# Patient Record
Sex: Female | Born: 1960 | Race: Black or African American | Hispanic: No | Marital: Married | Smoking: Never smoker
Health system: Southern US, Community
[De-identification: ages and names within clinical notes are randomized; demographics above are authoritative.]

## PROBLEM LIST (undated history)

## (undated) DIAGNOSIS — E119 Type 2 diabetes mellitus without complications: Secondary | ICD-10-CM

## (undated) HISTORY — DX: Type 2 diabetes mellitus without complications: E11.9

## (undated) HISTORY — PX: KNEE SURGERY: SHX244

## (undated) HISTORY — PX: TUBAL LIGATION: SHX77

---

## 1998-08-25 ENCOUNTER — Other Ambulatory Visit: Admission: RE | Admit: 1998-08-25 | Discharge: 1998-08-25 | Payer: Self-pay | Admitting: Obstetrics and Gynecology

## 1998-09-20 ENCOUNTER — Ambulatory Visit (HOSPITAL_COMMUNITY): Admission: RE | Admit: 1998-09-20 | Discharge: 1998-09-20 | Payer: Self-pay | Admitting: Obstetrics and Gynecology

## 1999-10-24 ENCOUNTER — Other Ambulatory Visit: Admission: RE | Admit: 1999-10-24 | Discharge: 1999-10-24 | Payer: Self-pay | Admitting: Obstetrics and Gynecology

## 2000-10-30 ENCOUNTER — Other Ambulatory Visit: Admission: RE | Admit: 2000-10-30 | Discharge: 2000-10-30 | Payer: Self-pay | Admitting: Obstetrics and Gynecology

## 2000-11-29 ENCOUNTER — Ambulatory Visit (HOSPITAL_COMMUNITY): Admission: RE | Admit: 2000-11-29 | Discharge: 2000-11-29 | Payer: Self-pay

## 2003-02-13 ENCOUNTER — Emergency Department (HOSPITAL_COMMUNITY): Admission: EM | Admit: 2003-02-13 | Discharge: 2003-02-14 | Payer: Self-pay | Admitting: Emergency Medicine

## 2003-02-14 ENCOUNTER — Emergency Department (HOSPITAL_COMMUNITY): Admission: EM | Admit: 2003-02-14 | Discharge: 2003-02-14 | Payer: Self-pay | Admitting: Emergency Medicine

## 2003-02-14 ENCOUNTER — Encounter: Payer: Self-pay | Admitting: Emergency Medicine

## 2003-11-05 ENCOUNTER — Other Ambulatory Visit: Admission: RE | Admit: 2003-11-05 | Discharge: 2003-11-05 | Payer: Self-pay | Admitting: Obstetrics and Gynecology

## 2003-11-26 ENCOUNTER — Encounter (INDEPENDENT_AMBULATORY_CARE_PROVIDER_SITE_OTHER): Payer: Self-pay

## 2003-11-26 ENCOUNTER — Ambulatory Visit (HOSPITAL_COMMUNITY): Admission: RE | Admit: 2003-11-26 | Discharge: 2003-11-26 | Payer: Self-pay | Admitting: Obstetrics and Gynecology

## 2004-11-14 ENCOUNTER — Other Ambulatory Visit: Admission: RE | Admit: 2004-11-14 | Discharge: 2004-11-14 | Payer: Self-pay | Admitting: Obstetrics and Gynecology

## 2005-03-20 ENCOUNTER — Other Ambulatory Visit: Admission: RE | Admit: 2005-03-20 | Discharge: 2005-03-20 | Payer: Self-pay | Admitting: Obstetrics and Gynecology

## 2005-10-05 ENCOUNTER — Other Ambulatory Visit: Admission: RE | Admit: 2005-10-05 | Discharge: 2005-10-05 | Payer: Self-pay | Admitting: Obstetrics and Gynecology

## 2006-10-28 ENCOUNTER — Other Ambulatory Visit: Admission: RE | Admit: 2006-10-28 | Discharge: 2006-10-28 | Payer: Self-pay | Admitting: Obstetrics and Gynecology

## 2007-03-17 ENCOUNTER — Emergency Department (HOSPITAL_COMMUNITY): Admission: EM | Admit: 2007-03-17 | Discharge: 2007-03-17 | Payer: Self-pay | Admitting: Emergency Medicine

## 2007-09-05 ENCOUNTER — Encounter: Admission: RE | Admit: 2007-09-05 | Discharge: 2007-09-05 | Payer: Self-pay | Admitting: Internal Medicine

## 2007-10-01 ENCOUNTER — Encounter: Admission: RE | Admit: 2007-10-01 | Discharge: 2007-10-01 | Payer: Self-pay | Admitting: Internal Medicine

## 2007-11-12 ENCOUNTER — Other Ambulatory Visit: Admission: RE | Admit: 2007-11-12 | Discharge: 2007-11-12 | Payer: Self-pay | Admitting: Obstetrics and Gynecology

## 2007-11-20 ENCOUNTER — Encounter: Admission: RE | Admit: 2007-11-20 | Discharge: 2007-11-20 | Payer: Self-pay | Admitting: Obstetrics and Gynecology

## 2008-01-28 ENCOUNTER — Encounter (INDEPENDENT_AMBULATORY_CARE_PROVIDER_SITE_OTHER): Payer: Self-pay | Admitting: Diagnostic Radiology

## 2008-01-28 ENCOUNTER — Other Ambulatory Visit: Admission: RE | Admit: 2008-01-28 | Discharge: 2008-01-28 | Payer: Self-pay | Admitting: Diagnostic Radiology

## 2008-01-28 ENCOUNTER — Encounter: Admission: RE | Admit: 2008-01-28 | Discharge: 2008-01-28 | Payer: Self-pay | Admitting: Internal Medicine
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
## 2008-11-25 ENCOUNTER — Encounter: Admission: RE | Admit: 2008-11-25 | Discharge: 2008-11-25 | Payer: Self-pay | Admitting: Obstetrics and Gynecology

## 2010-10-10 ENCOUNTER — Encounter: Admission: RE | Admit: 2010-10-10 | Discharge: 2010-10-10 | Payer: Self-pay | Admitting: Obstetrics

## 2010-11-29 ENCOUNTER — Encounter
Admission: RE | Admit: 2010-11-29 | Discharge: 2010-11-29 | Payer: Self-pay | Source: Home / Self Care | Attending: Internal Medicine | Admitting: Internal Medicine

## 2011-04-21 NOTE — H&P (Signed)
NAME:  Allison Lopez, Allison Lopez                        ACCOUNT NO.:  [id number]   MEDICAL RECORD NO.:  [id number]                   PATIENT TYPE:  AMB   LOCATION:  [facility]                                  FACILITY:  [facility]   PHYSICIAN:  James A. Ashley Royalty, M.D.             DATE OF BIRTH:  August 25, 1961   DATE OF ADMISSION:  11/25/2003  DATE OF DISCHARGE:                                HISTORY & PHYSICAL   HISTORY OF PRESENT ILLNESS:  This is a 49 year old nulligravida who  presented November 05, 2003 for annual examination. At the time of that  encounter, she complained of intramenstrual bleeding for 2 to 3 months. She  has a known fibroid uterus. She returned November 17, 2003 for  sonohysterogram. The sonohysterogram several echogenic foci within the  endometrial cavity. They were seen both on the anterior and the posterior  surfaces of the cavity. The patient presents for diagnostic/operative  hysteroscopy, dilatation and curettage.   MEDICATIONS:  None.   PAST MEDICAL HISTORY:  Negative.   PAST SURGICAL HISTORY:  Hysteroscopy, laser of the cervix, and knee surgery.   ALLERGIES:  None.   FAMILY HISTORY:  Positive for hypertension and diabetes mellitus and breast  cancer.   SOCIAL HISTORY:  The patient denies the use of tobacco or significant  alcohol.   REVIEW OF SYSTEMS:  Noncontributory.   PHYSICAL EXAMINATION:  GENERAL:  A well developed, well nourished, pleasant  black female in no acute distress.  VITAL SIGNS:  Stable.  SKIN:  Warm and dry without lesions.  LYMPH:  There is no supraclavicular, cervical, or inguinal adenopathy.  HEENT:  Normocephalic.  NECK:  Supple without thyromegaly.  CHEST:  Lungs are clear.  CARDIAC:  Regular rate and rhythm. Without murmur, rub, or gallop.  BREAST:  Examination deferred.  ABDOMEN:  Soft, nontender without masses or organomegaly. Bowel sounds are  active.  MUSCULOSKELETAL:  Examination reveals full range of motion  without edema,  cyanosis or  CVA tenderness.  PELVIC:  Examination (November 05, 2003) external genitalia within normal  limits. Vagina and cervix were without gross lesions. Bimanual examination  reveals the uterus to be 9 x 5 x 4 cm with several small fibroids. There are  no adnexal masses.   IMPRESSION:  1. Fibroid uterus.  2. Abnormal uterine bleeding with several echogenic foci on sonohysterogram,     consistent with endometrial polyps versus submucosal fibroids.   PLAN:  1. Diagnostic/operative hysteroscopy.  2. Dilatation and curettage.  3. Risks, benefits, and the alternatives were discussed with the patient.     She states that she understands and accepts. Questions were invited and     answered.  James A. Ashley Royalty, M.D.    JAM/MEDQ  D:  11/25/2003  T:  11/25/2003  Job:  [id number]

## 2011-04-21 NOTE — Op Note (Signed)
NAME:  Allison Lopez, Allison Lopez                        ACCOUNT NO.:  [id number]   MEDICAL RECORD NO.:  [id number]                   PATIENT TYPE:  AMB   LOCATION:  [facility]                                  FACILITY:  [facility]   PHYSICIAN:  James A. Ashley Royalty, M.D.             DATE OF BIRTH:  [birth year]   DATE OF PROCEDURE:  11/26/2003  DATE OF DISCHARGE:                                 OPERATIVE REPORT   PREOPERATIVE DIAGNOSES:  1. Fibroid uterus.  2. Abnormal uterine bleeding, possibly secondary to intrauterine polyp or     fibroid.   POSTOPERATIVE DIAGNOSES:  1. Fibroid uterus.  2. Abnormal uterine bleeding, possibly secondary to intrauterine polyp or     fibroid.  3. Pathology pending.  4. No obvious intrauterine polyp or fibroid.   PROCEDURES:  1. Diagnostic hysteroscopy.  2. Dilatation and curettage.   SURGEON:  Rudy Jew. Ashley Royalty, M.D.   ANESTHESIA:  General.   ESTIMATED BLOOD LOSS:  Less than 25 mL.   COMPLICATIONS:  None.   PACKS AND DRAINS:  None.   DESCRIPTION OF PROCEDURE:  The patient was taken to the operating room and  placed in the dorsal supine position, where after adequate general  anesthesia was administered she was placed in the lithotomy position and  prepped and draped in the usual manner for vaginal surgery.  A posterior  weighted retractor was placed per vagina.  The anterior lip of the cervix  was grasped with a single-tooth tenaculum.  The uterus was gently sounded to  8 cm and noted to be slightly anteverted.  The cervix was serially dilated  to a size 29 [place] using [employer] dilators.  The resectoscope was placed into  the uterine cavity using sorbitol as a distention medium.  The uterine  cavity was thoroughly inspected.  The endometrium was noted to be quite  fluffy.  Initially the endometrium was obscuring the visualization of the  tubal ostia; however, it was gently scraped off with the resectoscopic wire  without using any electricity so that visualization  of the ostia was  excellent.  Appropriate photos were obtained.  There was no evidence of any  intrauterine polyp or fibroid.   Attention was then turned to the uterine curettage.  A medium-sized curette  was placed in the uterine cavity.  Curettage was conducted.  First equal-  quadrant technique was used.  Then a therapeutic technique was used.  Curettings were submitted to pathology for histologic studies.  At this  point the formal procedure was ended.  The  tenaculum was removed and the small bleeder on the right aspect of the  tenaculum site was easily controlled with a 3-0 Vicryl interrupted suture.  Hemostasis was noted and the procedure terminated.   The patient was taken to the recovery room in excellent condition.  James A. Ashley Royalty, M.D.    JAM/MEDQ  D:  11/26/2003  T:  11/26/2003  Job:  [id number]

## 2011-04-21 NOTE — H&P (Signed)
[facility]  Patient:    Allison Lopez, Allison Lopez                       MRN: [id number] Adm. Date:  11/29/00 Attending:  Fayrene Fearing A. Ashley Royalty, M.D.                         History and Physical  HISTORY OF PRESENT ILLNESS:   This is a 50 year old nulligravida who presents herself for attempt at permanent surgical sterilization.  She was seen in the office on November 22, 2000, for ultrasound which revealed several intramural fibroids and a 1.7 cm complex cystic follicle of the left adnexa.  MEDICATIONS:                  Ortho-Novum 777.  PAST MEDICAL HISTORY:         Medical:  Negative.  Surgical:  Diagnostic hysteroscopy and D&C, laser of the cervix, knee surgery.  ALLERGIES:                    None.  FAMILY HISTORY:               Positive for hypertension, diabetes, and breast cancer.  SOCIAL HISTORY:               Patient denies the use of tobacco or significant alcohol.  REVIEW OF SYSTEMS:            Noncontributory.  PHYSICAL EXAMINATION:  GENERAL:                      A well-developed, well-nourished, pleasant black female in no acute distress, and afebrile.  VITAL SIGNS:                  Stable.  SKIN:                         Warm and dry without lesions.  LYMPH:                        There is no supraclavicular, cervical, or                               inguinal adenopathy.  HEENT:                        Normocephalic.  NECK:                         Supple without thyromegaly.  CHEST:                        Lungs are clear.  CARDIAC:                      Regular rate and rhythm without murmurs, gallops, or rubs.  BREASTS:                      Exam deferred.  ABDOMEN:                      Soft and nontender without masses or organomegaly.  Bowel sounds are active.  MUSCULOSKELETAL:  Examination reveals full range of motion without edema, cyanosis, or CVA tenderness.  PELVIC:                       Examination deferred  until examination under anesthesia.  IMPRESSION:                   1. Cystic follicle of the left adnexa.                               2. Fibroid uterus.                               3. Desire for attempt at permanent surgical                                  sterilization.  PLAN:                         Laparoscopic bilateral tubal sterilization procedure.  Risks, benefits, complications, and alternatives were fully discussed with the patient.  Permanency and failure rates of various techniques including but not limited to Falope-ring, bipolar cautery, mini laparotomy, and partial salpingectomy were discussed and accepted.  Questions were invited and answered. DD:  11/28/00 TD:  11/28/00 Job: [id number]

## 2011-04-21 NOTE — Op Note (Signed)
[facility]  Patient:    Allison Lopez, Allison Lopez                     MRN: [id number] Proc. Date: 11/29/00 Adm. Date:  [id number] Attending:  Wandalee Ferdinand                           Operative Report  PREOPERATIVE DIAGNOSIS:       Desires permanent surgical sterilization.  POSTOPERATIVE DIAGNOSIS:      Desires permanent surgical sterilization.  PROCEDURE:                    Laparoscopic bilateral tubal sterilization.                               (Falopes ring).  SURGEON:                      Rudy Jew. Ashley Royalty, M.D.  ANESTHESIA:                   General.  ESTIMATED BLOOD LOSS:         Less than 25 cc.  COMPLICATIONS:                None.  PACKS AND DRAINS:             None.  DESCRIPTION OF PROCEDURE:     The patient was sent to the operating room and placed in the dorsosupine position.  After adequate general anesthesia was administered, she was placed in the lithotomy position and prepped and draped in the usual manner for abdominal and vaginal surgery.  Posterior weighted retractor was placed into the vagina and the anterior lip of the cervix was grasped with a single-tooth tenaculum.  Jarcho uterine manipulator was placed per cervix and held in place with the tenaculum.  The bladder was drained with a red rubber catheter.  A 1.5 cm infraumbilical incision was made in the longitudinal plane.  A size 10-11 disposable laparoscopic trocar was placed in the abdominal cavity.  Its location was verified by placement of the laparoscope.  There was no evidence of any trauma.  Pneumoperitoneum was created with CO2 and maintained throughout the procedure.  Next, a midline suprapubic 7 mm trocar was placed into the abdominal cavity under direct visualization, using transillumination technique.  Through the Nissen and prior to these trocar placements, approximately 8 cc total of 0.25% Marcaine was placed into the skin to aid in analgesia.  The  pelvis was thoroughly surveyed.  The uterus was of normal size, shape and contour; without evidence of any polypoids or endometriosis.  The tubes were of normal size, shape, contour and length bilaterally, with luxuriant fimbria. The ovaries appeared to be approximately 3 x 3 x 2 cm bilaterally, without evidence of any endometriosis or abnormal cysts.  There appeared to be a stigma on the left ovary.  Appropriate photographs were obtained.  The remainder of the peritoneal surfaces were smooth and glistening, without evidence of any endometriosis or adhesions.  The right fallopian tube was grasped in the distal isthmus at proximal ______ ampullary portion and a Falopes ring applied.  An excellent knuckle of tube was noted to be contained within the ring.  Excellent lengths of tissue was noted.  The left fallopian tube was grasped at the distal isthmus to proximal  ampullary portion.  A Falopes ring was applied without difficulty.  An excellent knuckle of tube was noted to be contained within the ring. Excellent lengths of tissue was noted.  At this point the patient was felt to have benefited maximally from the procedure.  The abdominal instruments were removed.  The pneumoperitoneum evacuated.  Fascial defects were closed with 0 Vicryl in interrupted fashion. The skin was closed with Dermabond.  Vaginal instruments were removed. Hemostasis was noted.  The procedure was terminated.  The patient tolerated the procedure extremely well; returned to the recovery room in good condition. DD:  11/29/00 TD:  11/29/00 Job: [id number]

## 2011-06-15 ENCOUNTER — Other Ambulatory Visit: Payer: Self-pay | Admitting: Internal Medicine

## 2011-06-15 DIAGNOSIS — E042 Nontoxic multinodular goiter: Secondary | ICD-10-CM

## 2011-06-28 ENCOUNTER — Other Ambulatory Visit: Payer: Self-pay

## 2011-07-05 ENCOUNTER — Ambulatory Visit
Admission: RE | Admit: 2011-07-05 | Discharge: 2011-07-05 | Disposition: A | Discharge status: Home / Self Care | Payer: 59 | Source: Ambulatory Visit | Attending: Internal Medicine | Admitting: Internal Medicine

## 2011-07-05 ENCOUNTER — Other Ambulatory Visit (HOSPITAL_COMMUNITY)
Admission: RE | Admit: 2011-07-05 | Discharge: 2011-07-05 | Disposition: A | Discharge status: Home / Self Care | Payer: 59 | Source: Ambulatory Visit | Attending: Interventional Radiology | Admitting: Interventional Radiology

## 2011-07-05 DIAGNOSIS — E042 Nontoxic multinodular goiter: Secondary | ICD-10-CM

## 2011-07-05 DIAGNOSIS — E049 Nontoxic goiter, unspecified: Secondary | ICD-10-CM | POA: Insufficient documentation

## 2011-09-26 ENCOUNTER — Other Ambulatory Visit: Payer: Self-pay | Admitting: Obstetrics

## 2011-09-26 DIAGNOSIS — Z1231 Encounter for screening mammogram for malignant neoplasm of breast: Secondary | ICD-10-CM

## 2011-10-12 ENCOUNTER — Ambulatory Visit: Payer: 59

## 2011-11-09 ENCOUNTER — Ambulatory Visit: Payer: 59

## 2012-01-18 ENCOUNTER — Ambulatory Visit: Payer: 59

## 2012-01-19 ENCOUNTER — Ambulatory Visit: Payer: 59

## 2012-01-22 ENCOUNTER — Ambulatory Visit
Admission: RE | Admit: 2012-01-22 | Discharge: 2012-01-22 | Disposition: A | Discharge status: Home / Self Care | Payer: 59 | Source: Ambulatory Visit | Attending: Obstetrics | Admitting: Obstetrics

## 2012-01-22 DIAGNOSIS — Z1231 Encounter for screening mammogram for malignant neoplasm of breast: Secondary | ICD-10-CM

## 2012-08-06 ENCOUNTER — Other Ambulatory Visit: Payer: Self-pay | Admitting: Internal Medicine

## 2012-08-06 DIAGNOSIS — E063 Autoimmune thyroiditis: Secondary | ICD-10-CM

## 2012-08-06 DIAGNOSIS — E042 Nontoxic multinodular goiter: Secondary | ICD-10-CM

## 2012-08-14 ENCOUNTER — Ambulatory Visit
Admission: RE | Admit: 2012-08-14 | Discharge: 2012-08-14 | Disposition: A | Discharge status: Home / Self Care | Payer: 59 | Source: Ambulatory Visit | Attending: Internal Medicine | Admitting: Internal Medicine

## 2012-08-14 DIAGNOSIS — E042 Nontoxic multinodular goiter: Secondary | ICD-10-CM

## 2012-08-14 DIAGNOSIS — E063 Autoimmune thyroiditis: Secondary | ICD-10-CM

## 2012-12-06 ENCOUNTER — Encounter (INDEPENDENT_AMBULATORY_CARE_PROVIDER_SITE_OTHER): Payer: Self-pay

## 2012-12-09 ENCOUNTER — Encounter (INDEPENDENT_AMBULATORY_CARE_PROVIDER_SITE_OTHER): Payer: Self-pay | Admitting: Surgery

## 2012-12-09 ENCOUNTER — Ambulatory Visit (INDEPENDENT_AMBULATORY_CARE_PROVIDER_SITE_OTHER): Payer: 59 | Admitting: Surgery

## 2012-12-09 VITALS — BP 122/86 | HR 90 | Temp 97.8°F | Ht 66.0 in | Wt 210.2 lb

## 2012-12-09 DIAGNOSIS — E042 Nontoxic multinodular goiter: Secondary | ICD-10-CM | POA: Insufficient documentation

## 2012-12-09 NOTE — Patient Instructions (Signed)

## 2012-12-09 NOTE — Progress Notes (Signed)
General Surgery [facility]  Chief Complaint  Patient presents with  . New Evaluation    eval multinodular goiter - referral from Dr. Debara Pickett    HISTORY: Patient is a 52 year old black female referred by her endocrinologist for evaluation for total thyroidectomy. Patient was diagnosed by her primary care physician 3-4 years ago with thyroid goiter. She was followed up with clinical examination and ultrasound. She underwent percutaneous needle biopsy with benign cytopathology. Recent followup ultrasound examination demonstrated interval enlargement of the thyroid gland with the right lobe measuring 6.4 cm and the left lobe measuring 4.8 cm in maximum dimension. There were multiple dominant nodules in the right lobe measuring 3.9 cm, 3.2 cm, 1.9 cm, and 1.2 cm. Nodules in the left lobe were small with no nodule measuring more than 5 mm in diameter.  Patient has never been on thyroid medication. She has had no prior head or neck surgery. There is a family history of multinodular thyroid in the patient's mother. There is no family history of thyroid cancer. There is no family history of other endocrinopathy.  History reviewed. No pertinent past medical history.   No current outpatient prescriptions on file.     No Known Allergies   Family History  Problem Relation Age of Onset  . Cancer Maternal Aunt     breast  . Cancer Paternal Uncle     throat     History   Social History  . Marital Status: Married    Spouse Name: N/A    Number of Children: N/A  . Years of Education: N/A   Social History Main Topics  . Smoking status: Never Smoker   . Smokeless tobacco: None  . Alcohol Use: Yes     Comment: 1xweek  . Drug Use: No  . Sexually Active:    Other Topics Concern  . None   Social History Narrative  . None     REVIEW OF SYSTEMS - PERTINENT POSITIVES ONLY: Denies tremor. Denies palpitations. Denies compressive symptoms. Occasionally notes  "soreness".  EXAM: Filed Vitals:   12/09/12 1330  BP: 122/86  Pulse: 90  Temp: 97.8 F (36.6 C)    HEENT: normocephalic; pupils equal and reactive; sclerae clear; dentition good; mucous membranes moist NECK:  Enlarged multinodular right thyroid lobe measuring approximately 7 cm in greatest dimension; nontender; left lobe slightly firm without dominant mass; asymmetric on extension; no palpable anterior or posterior cervical lymphadenopathy; no supraclavicular masses; no tenderness CHEST: clear to auscultation bilaterally without rales, rhonchi, or wheezes CARDIAC: regular rate and rhythm without significant murmur; peripheral pulses are full EXT:  non-tender without edema; no deformity NEURO: no gross focal deficits; no sign of tremor   LABORATORY RESULTS: See [facility] ([facility]) for most recent results   RADIOLOGY RESULTS: See [facility] ([facility]) for most recent results   IMPRESSION: Multinodular thyroid goiter with interval enlargement of dominant right thyroid nodules  PLAN: I had a lengthy discussion with the patient and her mother. I provided them with written literature to review. We discussed the indications for thyroidectomy. The patient does not have an absolute indication for thyroidectomy and could be safely followed with sequential ultrasound examination, physical examination, and repeat percutaneous biopsy. However, after careful consideration, the patient and her mother would like to proceed with total thyroidectomy.  We reviewed the procedure of total thyroidectomy. We discussed risk and benefits of the procedure including the potential for recurrent laryngeal nerve injury and injury to parathyroid glands. We discussed  the procedure, the hospital course to be anticipated, and the postoperative recovery and returned activity. We discussed the need for lifelong thyroid hormone replacement therapy. They understand and wish to proceed.  The risks and  benefits of the procedure have been discussed at length with the patient.  The patient understands the proposed procedure, potential alternative treatments, and the course of recovery to be expected.  All of the patient's questions have been answered at this time.  The patient wishes to proceed with surgery.  Velora Heckler, MD, FACS General & Endocrine Surgery [facility]   Visit Diagnoses: 1. Multinodular goiter (nontoxic)     Primary Care Physician: Katy Apo, MD

## 2013-02-19 IMAGING — US US THYROID BIOPSY
1 series · 13 of 13 positions shown · non-contrast
Comparison: none

CLINICAL DATA: Interval increase in size of the right thyroid
nodules.

ULTRASOUND-GUIDED THYROID ASPIRATION BIOPSY, MID-RIGHT
TECHNIQUE: Survey ultrasound was performed and the dominant lesion
in the mid right lobe was localized.  An appropriate skin entry
site was determined.  Skin was marked, then prepped with Betadine,
draped in usual sterile fashion, and infiltrated locally with 1%
lidocaine.  Under real-time ultrasound guidance, 3  passes were
made into the lesion with 25 gauge needles.  The patient tolerated
procedure well, with no immediate complications.
IMPRESSION
1.  Technically successful ultrasound-guided thyroid aspiration
biopsy
.
ULTRASOUND-GUIDED THYROID ASPIRATION BIOPSY, INFERIOR RIGHT
in the inferior pole right lobe was localized.  An appropriate skin
entry site was determined.  Skin was marked, then prepped with
Betadine, draped in usual sterile fashion, and infiltrated locally
with 1% lidocaine.  Under real-time ultrasound guidance, 3  passes
were made into the lesion with 25 gauge needles.  The patient
tolerated procedure well, with no immediate complications.

[Series 1: us thyroid biopsy · 0.08mm/px · 13 acquisitions, 13 frames shown]
[im 1/13]
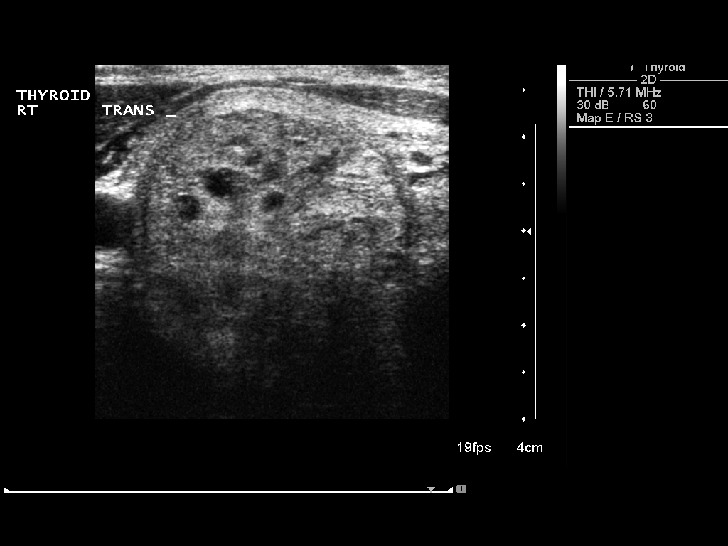
[im 2/13]
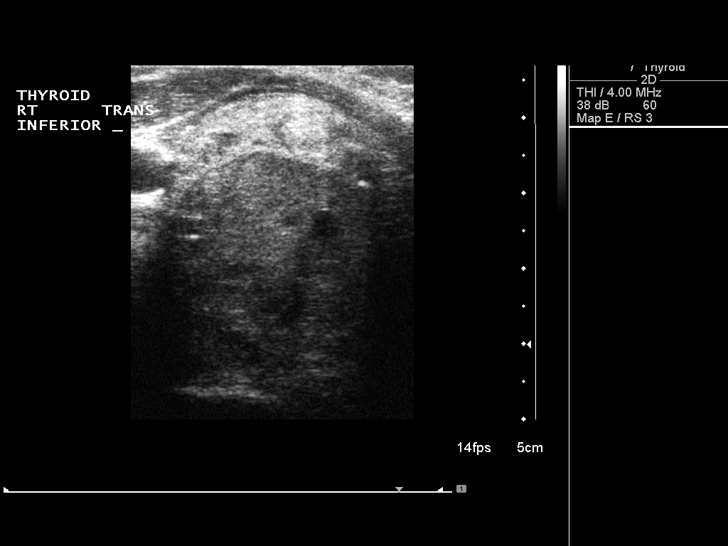
[im 3/13]
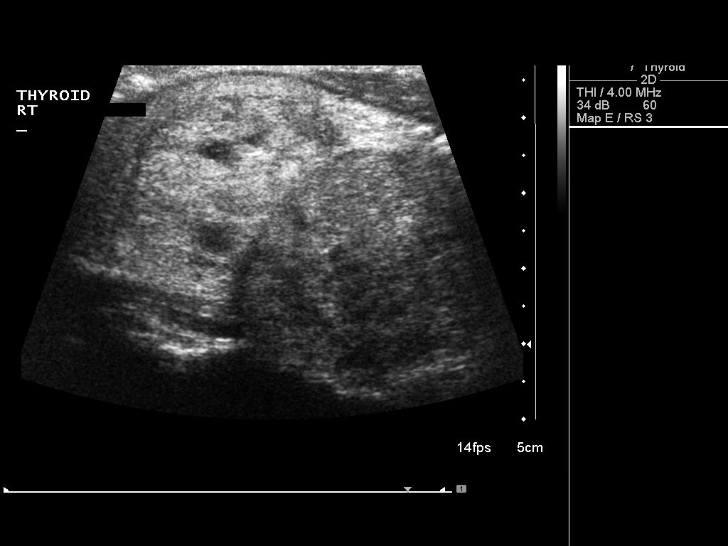
[im 4/13]
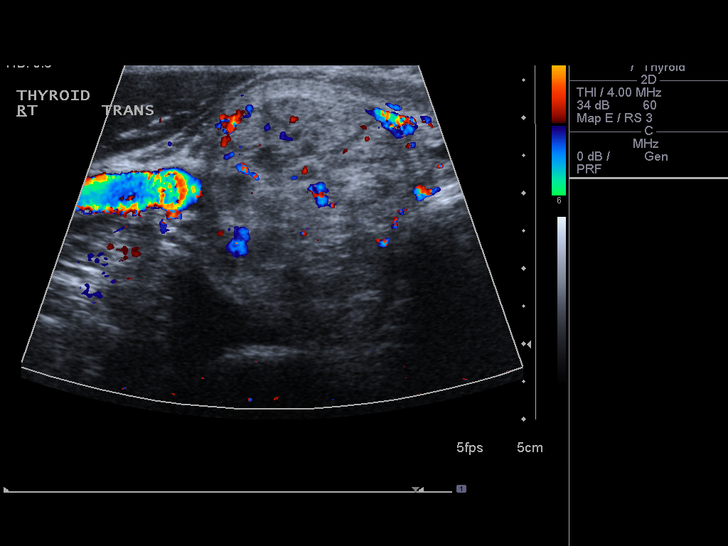
[im 5/13]
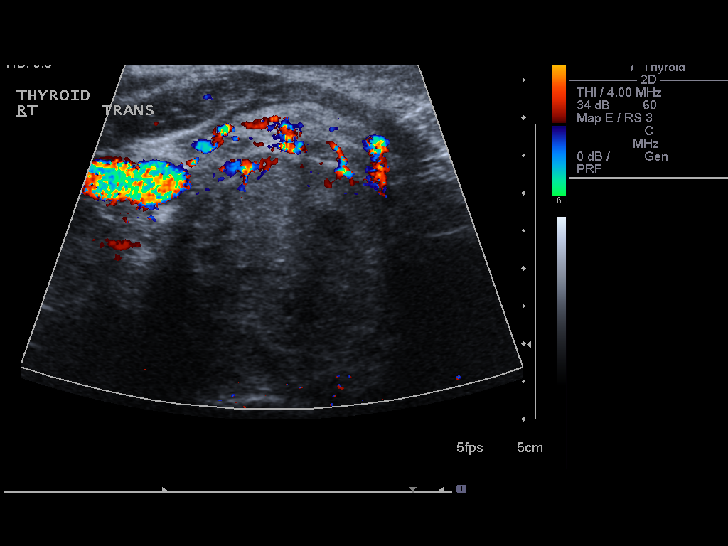
[im 6/13]
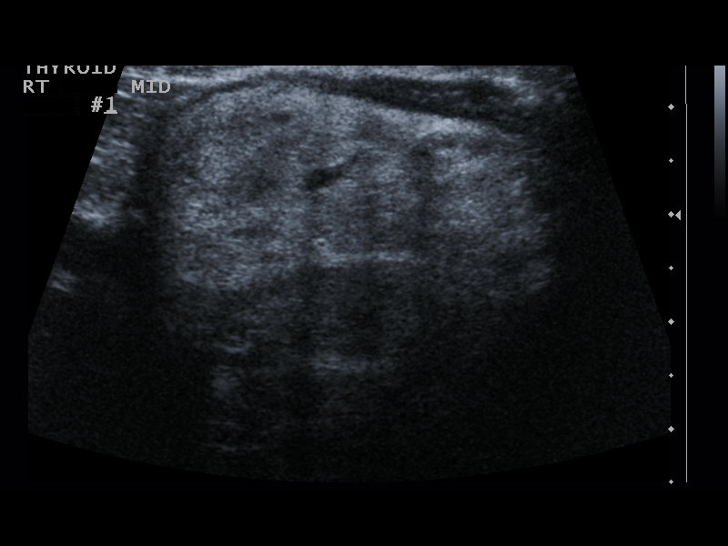
[im 7/13]
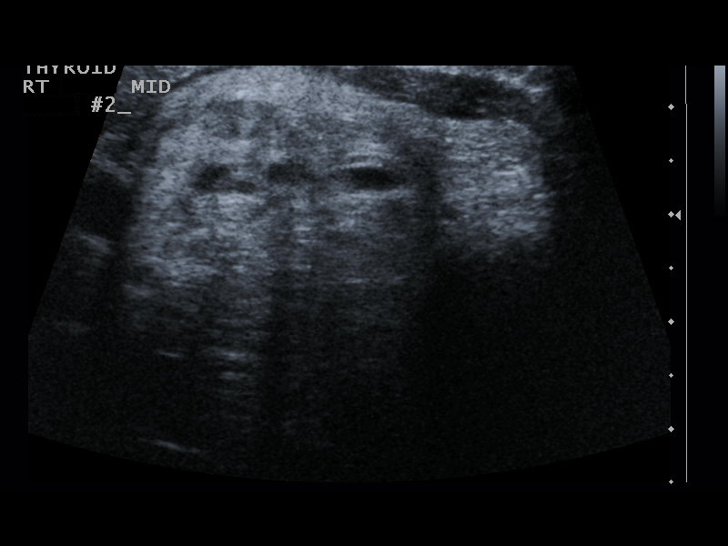
[im 8/13]
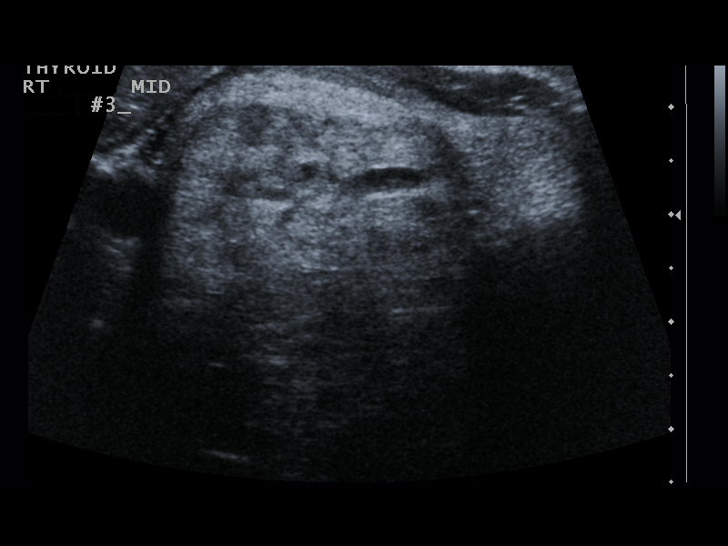
[im 9/13]
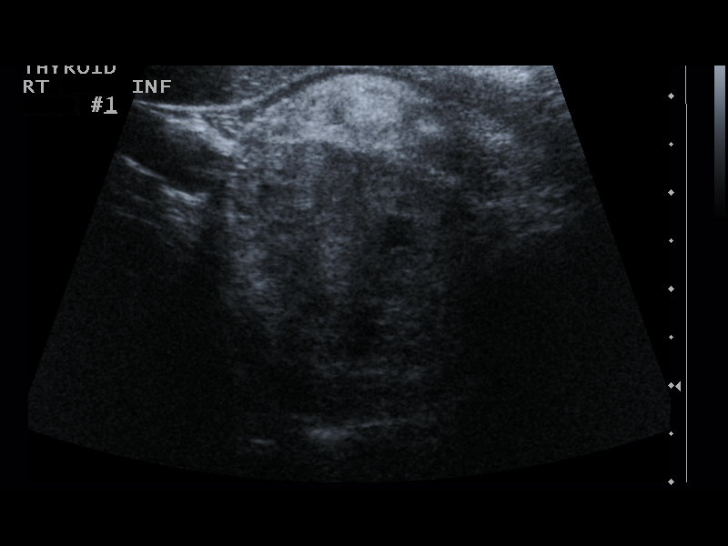
[im 10/13]
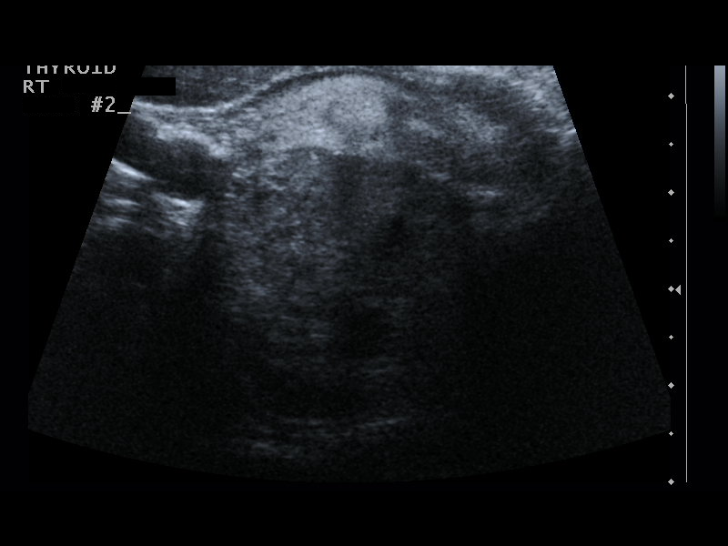
[im 11/13]
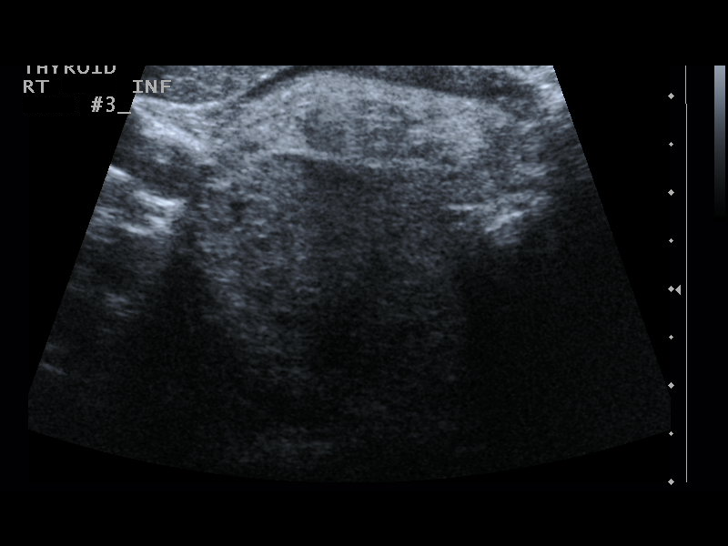
[im 12/13]
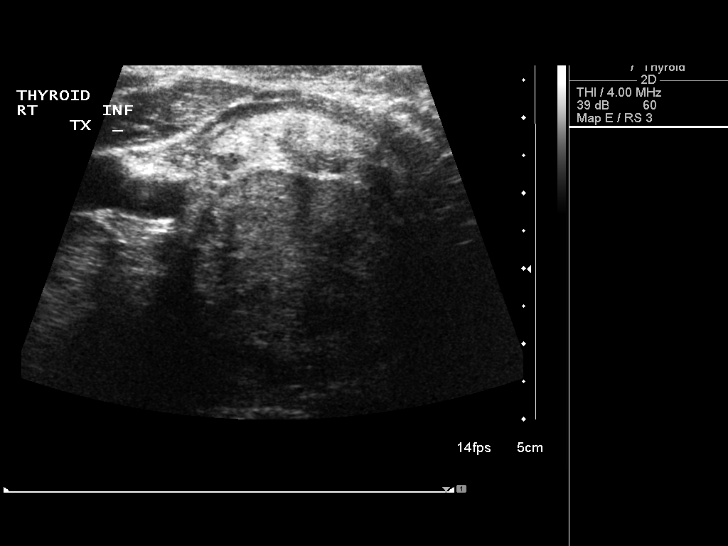
[im 13/13]
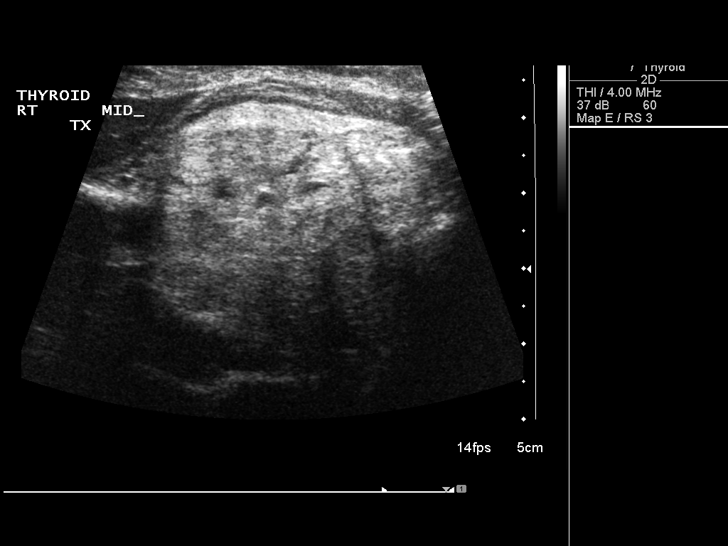

[13 of 13 positions shown; findings below may reference images not displayed]

## 2014-05-25 ENCOUNTER — Other Ambulatory Visit: Payer: Self-pay

## 2014-05-25 DIAGNOSIS — Z1231 Encounter for screening mammogram for malignant neoplasm of breast: Secondary | ICD-10-CM

## 2014-05-29 ENCOUNTER — Ambulatory Visit
Admission: RE | Admit: 2014-05-29 | Discharge: 2014-05-29 | Disposition: A | Discharge status: Home / Self Care | Payer: 59 | Source: Ambulatory Visit

## 2014-05-29 DIAGNOSIS — Z1231 Encounter for screening mammogram for malignant neoplasm of breast: Secondary | ICD-10-CM

## 2014-06-01 ENCOUNTER — Other Ambulatory Visit: Payer: Self-pay | Admitting: Obstetrics

## 2014-06-01 DIAGNOSIS — R928 Other abnormal and inconclusive findings on diagnostic imaging of breast: Secondary | ICD-10-CM

## 2014-06-10 ENCOUNTER — Other Ambulatory Visit: Payer: Self-pay | Admitting: Obstetrics

## 2014-06-10 ENCOUNTER — Ambulatory Visit
Admission: RE | Admit: 2014-06-10 | Discharge: 2014-06-10 | Disposition: A | Discharge status: Home / Self Care | Payer: 59 | Source: Ambulatory Visit | Attending: Obstetrics | Admitting: Obstetrics

## 2014-06-10 DIAGNOSIS — R928 Other abnormal and inconclusive findings on diagnostic imaging of breast: Secondary | ICD-10-CM

## 2014-06-10 DIAGNOSIS — N6310 Unspecified lump in the right breast, unspecified quadrant: Secondary | ICD-10-CM

## 2014-06-17 ENCOUNTER — Ambulatory Visit
Admission: RE | Admit: 2014-06-17 | Discharge: 2014-06-17 | Disposition: A | Discharge status: Home / Self Care | Payer: 59 | Source: Ambulatory Visit | Attending: Obstetrics | Admitting: Obstetrics

## 2014-06-17 DIAGNOSIS — N6310 Unspecified lump in the right breast, unspecified quadrant: Secondary | ICD-10-CM

## 2014-06-18 ENCOUNTER — Other Ambulatory Visit: Payer: Self-pay | Admitting: Obstetrics

## 2014-06-18 DIAGNOSIS — R921 Mammographic calcification found on diagnostic imaging of breast: Secondary | ICD-10-CM

## 2014-06-22 ENCOUNTER — Ambulatory Visit
Admission: RE | Admit: 2014-06-22 | Discharge: 2014-06-22 | Disposition: A | Discharge status: Home / Self Care | Payer: 59 | Source: Ambulatory Visit | Attending: Obstetrics | Admitting: Obstetrics

## 2014-06-22 ENCOUNTER — Other Ambulatory Visit: Payer: Self-pay | Admitting: Obstetrics

## 2014-06-22 DIAGNOSIS — R921 Mammographic calcification found on diagnostic imaging of breast: Secondary | ICD-10-CM

## 2014-06-22 DIAGNOSIS — N6310 Unspecified lump in the right breast, unspecified quadrant: Secondary | ICD-10-CM

## 2014-06-23 ENCOUNTER — Other Ambulatory Visit: Payer: Self-pay | Admitting: Internal Medicine

## 2014-06-23 DIAGNOSIS — E049 Nontoxic goiter, unspecified: Secondary | ICD-10-CM

## 2014-06-29 ENCOUNTER — Ambulatory Visit
Admission: RE | Admit: 2014-06-29 | Discharge: 2014-06-29 | Disposition: A | Discharge status: Home / Self Care | Payer: 59 | Source: Ambulatory Visit | Attending: Internal Medicine | Admitting: Internal Medicine

## 2014-06-29 DIAGNOSIS — E049 Nontoxic goiter, unspecified: Secondary | ICD-10-CM

## 2015-01-21 ENCOUNTER — Inpatient Hospital Stay: Admission: RE | Admit: 2015-01-21 | Payer: Self-pay | Source: Ambulatory Visit

## 2015-01-21 ENCOUNTER — Ambulatory Visit
Admission: RE | Admit: 2015-01-21 | Discharge: 2015-01-21 | Disposition: A | Discharge status: Home / Self Care | Payer: 59 | Source: Ambulatory Visit | Attending: Obstetrics | Admitting: Obstetrics

## 2015-01-21 DIAGNOSIS — N6310 Unspecified lump in the right breast, unspecified quadrant: Secondary | ICD-10-CM

## 2015-08-04 ENCOUNTER — Ambulatory Visit: Payer: 59 | Attending: Physician Assistant

## 2015-08-04 DIAGNOSIS — R609 Edema, unspecified: Secondary | ICD-10-CM | POA: Diagnosis present

## 2015-08-04 DIAGNOSIS — R29898 Other symptoms and signs involving the musculoskeletal system: Secondary | ICD-10-CM | POA: Diagnosis present

## 2015-08-04 DIAGNOSIS — M25569 Pain in unspecified knee: Secondary | ICD-10-CM | POA: Diagnosis present

## 2015-08-04 DIAGNOSIS — R600 Localized edema: Secondary | ICD-10-CM

## 2015-08-04 DIAGNOSIS — M228X9 Other disorders of patella, unspecified knee: Secondary | ICD-10-CM

## 2015-08-04 NOTE — Therapy (Addendum)
[place], [place], [address] Phone: [phone]   Fax:  [phone]  Physical Therapy Evaluation  Patient Details  Name: Allison Lopez MRN: [id number] Date of Birth: [birth year] Referring Provider:  Gerrit Halls, PA-C  Encounter Date: 08/04/2015      PT End of Session - 08/04/15 0748    Visit Number 1   Number of Visits 6   Date for PT Re-Evaluation 09/15/15   PT Start Time 0711   PT Stop Time 0750   PT Time Calculation (min) 39 min   Activity Tolerance Patient tolerated treatment well      No past medical history on file.  Past Surgical History  Procedure Laterality Date  . Tubal ligation    . Knee surgery      There were no vitals filed for this visit.  Visit Diagnosis:  Edema - Plan: PT plan of care cert/re-cert  Patellar tracking disorder, unspecified laterality - Plan: PT plan of care cert/re-cert  Weakness of hip, unspecified laterality - Plan: PT plan of care cert/re-cert      Subjective Assessment - 08/04/15 0713    Subjective My knee caps are tilted out and need exercise to put them in middle.  I have had knee surgery RT knee for displaced patella. She reports no functional problems. She has some swelling daily on RT knee. . I have a brace   How long can you sit comfortably? As needed   How long can you stand comfortably? as needed   How long can you walk comfortably? As needed   Diagnostic tests xray :OA   Patient Stated Goals For improved strength to align patella   Currently in Pain? No/denies            [facility] PT Assessment - 08/04/15 0718    Assessment   Medical Diagnosis OA both knees   Onset Date/Surgical Date --  10 years ago   Next MD Visit PRN   Prior Therapy None   Precautions   Precautions None   Restrictions   Weight Bearing Restrictions No   Balance Screen   Has the patient fallen in the past 6 months No   Prior Function   Level of Independence  Independent   Cognition   Overall Cognitive Status Within Functional Limits for tasks assessed   ROM / Strength   AROM / PROM / Strength AROM;Strength   AROM   AROM Assessment Site Knee;Hip   Right/Left Knee Right;Left   Right Knee Extension 0   Right Knee Flexion 128   Left Knee Extension 0   Left Knee Flexion 128   Strength   Strength Assessment Site Hip;Knee   Right/Left Hip Right;Left   Right Hip Flexion 5/5   Right Hip Extension 4/5   Right Hip External Rotation  --  5/5   Right Hip Internal Rotation  --  5/5   Right Hip ABduction 4+/5   Right Hip ADduction 5/5   Left Hip Flexion 5/5   Left Hip Extension 4/5   Left Hip External Rotation  --  5/5   Left Hip Internal Rotation  --  5/5   Left Hip ABduction 4+/5   Left Hip ADduction 5/5   Right/Left Knee Right;Left   Right Knee Flexion 4+/5   Right Knee Extension 5/5   Left Knee Flexion 4+/5   Left Knee Extension 5/5   Flexibility   Soft Tissue Assessment /Muscle Length yes   Hamstrings 85  degrees bilaterally   Palpation   Patella mobility WNL    Palpation comment PAtella tiled and lateral bilaterally but RT patella drifts significantly laterally with quad contraction with crepitus noted. LT no crepitus   Ambulation/Gait   Gait Comments WNL                           PT Education - 08/04/15 0740    Education provided Yes   Education Details POC Quads exercise   Person(s) Educated Patient   Methods Explanation;Demonstration;Tactile cues;Verbal cues;Handout   Comprehension Returned demonstration;Verbalized understanding          PT Short Term Goals - 08/04/15 0753    PT SHORT TERM GOAL #1   Title She will be independent with inital HEP   Time 3   Period Weeks   Status New           PT Long Term Goals - 08/04/15 0753    PT LONG TERM GOAL #1   Title She will be independent with all HEP issued as of last visit   Time 6   Period Weeks   Status New               Plan -  08/04/15 0749    Clinical Impression Statement Ms Kiesel demo some mild weakness in both hip extension and abduciton but has notrmal strength in LE. Range is WNL.  She has somealignment issues with patella and significant lateral tracking of RT patella with terminal exension. Will keep exercies less than full range initially. No pain. mild edema RT knee.   She should do well woithj an exercise program though it is not likely the RT patella tracking will improve si=gnificantly due to previous surgery and distance it moves laterally   Pt will benefit from skilled therapeutic intervention in order to improve on the following deficits Decreased strength  patella tracking laterally   Rehab Potential Good   PT Frequency 1x / week   PT Duration 6 weeks   PT Treatment/Interventions Taping;Manual techniques;Therapeutic exercise;Cryotherapy   PT Next Visit Plan REview HEP and add resistance if able   PT Home Exercise Plan Quad strength   Consulted and Agree with Plan of Care Patient         Problem List Patient Active Problem List   Diagnosis Date Noted  . 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 Patient is being discharged due to not returning since the last visit.  ?????    Darrel Hoover, PT              10/25/15                   12:56 PM

## 2015-08-04 NOTE — Patient Instructions (Signed)
Issued form cabinet: SAQ sitting, wall sits  Daily 10-25 reps 5 sec hold

## 2015-08-12 ENCOUNTER — Ambulatory Visit: Payer: 59

## 2015-08-26 ENCOUNTER — Ambulatory Visit: Payer: 59

## 2015-11-03 ENCOUNTER — Other Ambulatory Visit: Payer: Self-pay | Admitting: Internal Medicine

## 2015-11-03 DIAGNOSIS — E049 Nontoxic goiter, unspecified: Secondary | ICD-10-CM

## 2015-11-03 DIAGNOSIS — E063 Autoimmune thyroiditis: Secondary | ICD-10-CM

## 2015-11-05 ENCOUNTER — Ambulatory Visit
Admission: RE | Admit: 2015-11-05 | Discharge: 2015-11-05 | Disposition: A | Discharge status: Home / Self Care | Payer: 59 | Source: Ambulatory Visit | Attending: Internal Medicine | Admitting: Internal Medicine

## 2015-11-05 DIAGNOSIS — E049 Nontoxic goiter, unspecified: Secondary | ICD-10-CM

## 2015-11-05 DIAGNOSIS — E063 Autoimmune thyroiditis: Secondary | ICD-10-CM

## 2016-08-15 ENCOUNTER — Other Ambulatory Visit: Payer: Self-pay | Admitting: Internal Medicine

## 2016-08-15 DIAGNOSIS — N6489 Other specified disorders of breast: Secondary | ICD-10-CM

## 2016-08-24 ENCOUNTER — Ambulatory Visit
Admission: RE | Admit: 2016-08-24 | Discharge: 2016-08-24 | Disposition: A | Discharge status: Home / Self Care | Payer: 59 | Source: Ambulatory Visit | Attending: Internal Medicine | Admitting: Internal Medicine

## 2016-08-24 DIAGNOSIS — N6489 Other specified disorders of breast: Secondary | ICD-10-CM

## 2017-03-19 ENCOUNTER — Ambulatory Visit: Payer: Self-pay | Admitting: Podiatry

## 2017-03-22 ENCOUNTER — Ambulatory Visit (INDEPENDENT_AMBULATORY_CARE_PROVIDER_SITE_OTHER): Payer: 59

## 2017-03-22 ENCOUNTER — Ambulatory Visit (INDEPENDENT_AMBULATORY_CARE_PROVIDER_SITE_OTHER): Payer: 59 | Admitting: Podiatry

## 2017-03-22 ENCOUNTER — Encounter: Payer: Self-pay | Admitting: Podiatry

## 2017-03-22 DIAGNOSIS — M216X9 Other acquired deformities of unspecified foot: Secondary | ICD-10-CM

## 2017-03-22 DIAGNOSIS — M722 Plantar fascial fibromatosis: Secondary | ICD-10-CM | POA: Diagnosis not present

## 2017-03-22 MED ORDER — MELOXICAM 15 MG PO TABS: 15.0000 mg | ORAL_TABLET | Freq: Every day | ORAL | 2 refills | Status: AC

## 2017-03-22 NOTE — Progress Notes (Signed)
Subjective:     Patient ID: Allison Lopez, female   DOB: 17-Dec-1960, 56 y.o.   MRN: [id number]  HPI 56 year old female presents the also concerns of right plantar heel pain which is been ongoing for about 3 months. She states that it is a throbbing pain along the radial is intermittent in nature. She states that she previously decided she walking outside aspect of her foot and this and ongoing that she had knee surgery. She denies any numbness or tingling. The pain does not wake her up at night. She also describes as a sharp pain at times. She's had no recent treatment for this. She has no other complaints today.  Review of Systems  All other systems reviewed and are negative.      Objective:   Physical Exam General: AAO x3, NAD  Dermatological: Skin is warm, dry and supple bilateral. Nails x 10 are well manicured; remaining integument appears unremarkable at this time. There are no open sores, no preulcerative lesions, no rash or signs of infection present.  Vascular: Dorsalis Pedis artery and Posterior Tibial artery pedal pulses are 2/4 bilateral with immedate capillary fill time. There is no pain with calf compression, swelling, warmth, erythema.   Neruologic: Grossly intact via light touch bilateral. Vibratory intact via tuning fork bilateral. Protective threshold with Semmes Wienstein monofilament intact to all pedal sites bilateral.   Musculoskeletal: Tenderness to palpation along the plantar medial tubercle of the calcaneus at the insertion of plantar fascia on the right foot. There is no pain along the course of the plantar fascia within the arch of the foot. Plantar fascia appears to be intact. There is no pain with lateral compression of the calcaneus or pain with vibratory sensation. There is no pain along the course or insertion of the achilles tendon. No other areas of tenderness to bilateral lower extremities. Cavus foot type is present. On gait evaluation she is walking on the  outside aspect of her foot there is no pronation. Muscular strength 5/5 in all groups tested bilateral.  Gait: Unassisted, Nonantalgic.      Assessment:     Right heel pain, plantar fasciitis, cavus foot type    Plan:     -Treatment options discussed including all alternatives, risks, and complications -Etiology of symptoms were discussed -X-rays were obtained and reviewed with the patient. No evidence of acute fracture. Inferior calcaneal spurring is present.  -Declined steroid injection today. -Prescribed mobic. Discussed side effects of the medication and directed to stop if any are to occur and call the office.  -Plantar fascial brace was dispensed. -Stretching, icing daily. -Discussed shoe gear modifications. -RTC 3 weeks or sooner if needed. Call any questions or concerns meantime.  Ovid Curd, DPM

## 2017-03-22 NOTE — Patient Instructions (Signed)

## 2017-04-09 ENCOUNTER — Ambulatory Visit: Payer: 59 | Admitting: Podiatry

## 2017-04-09 ENCOUNTER — Telehealth: Payer: Self-pay | Admitting: *Deleted

## 2017-04-09 NOTE — Telephone Encounter (Signed)
Left message for patient to call and schedule appointment to pick up orthotics.

## 2017-05-15 ENCOUNTER — Other Ambulatory Visit: Payer: 59 | Admitting: Orthotics

## 2017-05-28 DIAGNOSIS — M170 Bilateral primary osteoarthritis of knee: Secondary | ICD-10-CM | POA: Diagnosis not present

## 2017-07-02 ENCOUNTER — Ambulatory Visit: Payer: 59 | Admitting: Podiatry

## 2017-07-23 ENCOUNTER — Ambulatory Visit: Payer: 59 | Admitting: Podiatry

## 2017-07-30 ENCOUNTER — Other Ambulatory Visit: Payer: Self-pay | Admitting: Podiatry

## 2017-08-09 ENCOUNTER — Ambulatory Visit: Payer: 59 | Admitting: Podiatry

## 2017-09-17 DIAGNOSIS — R040 Epistaxis: Secondary | ICD-10-CM | POA: Diagnosis not present

## 2017-09-17 DIAGNOSIS — E063 Autoimmune thyroiditis: Secondary | ICD-10-CM | POA: Diagnosis not present

## 2017-09-17 DIAGNOSIS — Z5181 Encounter for therapeutic drug level monitoring: Secondary | ICD-10-CM | POA: Diagnosis not present

## 2017-09-17 DIAGNOSIS — E1122 Type 2 diabetes mellitus with diabetic chronic kidney disease: Secondary | ICD-10-CM | POA: Diagnosis not present

## 2017-09-17 DIAGNOSIS — E042 Nontoxic multinodular goiter: Secondary | ICD-10-CM | POA: Diagnosis not present

## 2017-09-18 DIAGNOSIS — I10 Essential (primary) hypertension: Secondary | ICD-10-CM | POA: Diagnosis not present

## 2017-09-18 DIAGNOSIS — Z23 Encounter for immunization: Secondary | ICD-10-CM | POA: Diagnosis not present

## 2017-09-18 DIAGNOSIS — E1122 Type 2 diabetes mellitus with diabetic chronic kidney disease: Secondary | ICD-10-CM | POA: Diagnosis not present

## 2017-09-20 ENCOUNTER — Other Ambulatory Visit: Payer: Self-pay | Admitting: Internal Medicine

## 2017-09-20 DIAGNOSIS — E042 Nontoxic multinodular goiter: Secondary | ICD-10-CM

## 2017-09-27 ENCOUNTER — Other Ambulatory Visit: Payer: 59

## 2017-10-12 ENCOUNTER — Other Ambulatory Visit: Payer: Self-pay | Admitting: Internal Medicine

## 2017-10-12 DIAGNOSIS — Z1231 Encounter for screening mammogram for malignant neoplasm of breast: Secondary | ICD-10-CM

## 2017-10-23 ENCOUNTER — Other Ambulatory Visit: Payer: Self-pay

## 2017-10-30 ENCOUNTER — Ambulatory Visit
Admission: RE | Admit: 2017-10-30 | Discharge: 2017-10-30 | Disposition: A | Discharge status: Home / Self Care | Payer: 59 | Source: Ambulatory Visit | Attending: Internal Medicine | Admitting: Internal Medicine

## 2017-10-30 DIAGNOSIS — E042 Nontoxic multinodular goiter: Secondary | ICD-10-CM

## 2017-11-14 ENCOUNTER — Ambulatory Visit: Payer: Self-pay

## 2017-11-15 ENCOUNTER — Ambulatory Visit
Admission: RE | Admit: 2017-11-15 | Discharge: 2017-11-15 | Disposition: A | Discharge status: Home / Self Care | Payer: 59 | Source: Ambulatory Visit | Attending: Internal Medicine | Admitting: Internal Medicine

## 2017-11-15 DIAGNOSIS — Z1231 Encounter for screening mammogram for malignant neoplasm of breast: Secondary | ICD-10-CM | POA: Diagnosis not present

## 2018-04-05 DIAGNOSIS — N182 Chronic kidney disease, stage 2 (mild): Secondary | ICD-10-CM | POA: Diagnosis not present

## 2018-04-05 DIAGNOSIS — E1122 Type 2 diabetes mellitus with diabetic chronic kidney disease: Secondary | ICD-10-CM | POA: Diagnosis not present

## 2018-04-05 DIAGNOSIS — E7800 Pure hypercholesterolemia, unspecified: Secondary | ICD-10-CM | POA: Diagnosis not present

## 2018-04-12 DIAGNOSIS — E063 Autoimmune thyroiditis: Secondary | ICD-10-CM | POA: Diagnosis not present

## 2018-04-12 DIAGNOSIS — E1122 Type 2 diabetes mellitus with diabetic chronic kidney disease: Secondary | ICD-10-CM | POA: Diagnosis not present

## 2018-04-12 DIAGNOSIS — E042 Nontoxic multinodular goiter: Secondary | ICD-10-CM | POA: Diagnosis not present

## 2018-07-17 ENCOUNTER — Other Ambulatory Visit: Payer: Self-pay | Admitting: Internal Medicine

## 2018-07-17 DIAGNOSIS — Z1231 Encounter for screening mammogram for malignant neoplasm of breast: Secondary | ICD-10-CM

## 2018-08-07 DIAGNOSIS — E042 Nontoxic multinodular goiter: Secondary | ICD-10-CM | POA: Diagnosis not present

## 2018-08-07 DIAGNOSIS — E063 Autoimmune thyroiditis: Secondary | ICD-10-CM | POA: Diagnosis not present

## 2018-08-07 DIAGNOSIS — E1122 Type 2 diabetes mellitus with diabetic chronic kidney disease: Secondary | ICD-10-CM | POA: Diagnosis not present

## 2018-08-12 DIAGNOSIS — E1122 Type 2 diabetes mellitus with diabetic chronic kidney disease: Secondary | ICD-10-CM | POA: Diagnosis not present

## 2018-08-12 DIAGNOSIS — I10 Essential (primary) hypertension: Secondary | ICD-10-CM | POA: Diagnosis not present

## 2018-08-12 DIAGNOSIS — E7800 Pure hypercholesterolemia, unspecified: Secondary | ICD-10-CM | POA: Diagnosis not present

## 2018-08-12 DIAGNOSIS — E039 Hypothyroidism, unspecified: Secondary | ICD-10-CM | POA: Diagnosis not present

## 2018-08-27 DIAGNOSIS — Z01419 Encounter for gynecological examination (general) (routine) without abnormal findings: Secondary | ICD-10-CM | POA: Diagnosis not present

## 2018-08-27 DIAGNOSIS — Z6830 Body mass index (BMI) 30.0-30.9, adult: Secondary | ICD-10-CM | POA: Diagnosis not present

## 2018-08-27 DIAGNOSIS — R05 Cough: Secondary | ICD-10-CM | POA: Diagnosis not present

## 2018-08-27 DIAGNOSIS — N898 Other specified noninflammatory disorders of vagina: Secondary | ICD-10-CM | POA: Diagnosis not present

## 2018-08-27 DIAGNOSIS — Z1231 Encounter for screening mammogram for malignant neoplasm of breast: Secondary | ICD-10-CM | POA: Diagnosis not present

## 2018-08-27 DIAGNOSIS — Z1159 Encounter for screening for other viral diseases: Secondary | ICD-10-CM | POA: Diagnosis not present

## 2018-08-27 DIAGNOSIS — Z118 Encounter for screening for other infectious and parasitic diseases: Secondary | ICD-10-CM | POA: Diagnosis not present

## 2018-11-15 ENCOUNTER — Other Ambulatory Visit: Payer: Self-pay | Admitting: Internal Medicine

## 2018-11-15 DIAGNOSIS — E063 Autoimmune thyroiditis: Secondary | ICD-10-CM

## 2018-11-15 DIAGNOSIS — E042 Nontoxic multinodular goiter: Secondary | ICD-10-CM

## 2018-11-15 DIAGNOSIS — E1122 Type 2 diabetes mellitus with diabetic chronic kidney disease: Secondary | ICD-10-CM | POA: Diagnosis not present

## 2018-11-18 ENCOUNTER — Ambulatory Visit
Admission: RE | Admit: 2018-11-18 | Discharge: 2018-11-18 | Disposition: A | Discharge status: Home / Self Care | Payer: 59 | Source: Ambulatory Visit | Attending: Internal Medicine | Admitting: Internal Medicine

## 2018-11-18 ENCOUNTER — Ambulatory Visit: Payer: 59

## 2018-11-18 DIAGNOSIS — E063 Autoimmune thyroiditis: Secondary | ICD-10-CM

## 2018-11-18 DIAGNOSIS — E042 Nontoxic multinodular goiter: Secondary | ICD-10-CM | POA: Diagnosis not present

## 2019-11-11 ENCOUNTER — Other Ambulatory Visit: Payer: Self-pay | Admitting: Internal Medicine

## 2019-11-11 DIAGNOSIS — E063 Autoimmune thyroiditis: Secondary | ICD-10-CM

## 2019-11-11 DIAGNOSIS — E042 Nontoxic multinodular goiter: Secondary | ICD-10-CM

## 2019-11-18 ENCOUNTER — Other Ambulatory Visit: Payer: 59

## 2019-11-24 ENCOUNTER — Ambulatory Visit
Admission: RE | Admit: 2019-11-24 | Discharge: 2019-11-24 | Disposition: A | Discharge status: Home / Self Care | Payer: 59 | Source: Ambulatory Visit | Attending: Internal Medicine | Admitting: Internal Medicine

## 2019-11-24 DIAGNOSIS — E042 Nontoxic multinodular goiter: Secondary | ICD-10-CM

## 2019-11-24 DIAGNOSIS — E063 Autoimmune thyroiditis: Secondary | ICD-10-CM

## 2020-10-19 ENCOUNTER — Other Ambulatory Visit: Payer: Self-pay | Admitting: Internal Medicine

## 2020-10-19 DIAGNOSIS — E1122 Type 2 diabetes mellitus with diabetic chronic kidney disease: Secondary | ICD-10-CM

## 2020-11-03 ENCOUNTER — Ambulatory Visit
Admission: RE | Admit: 2020-11-03 | Discharge: 2020-11-03 | Disposition: A | Discharge status: Home / Self Care | Payer: 59 | Source: Ambulatory Visit | Attending: Internal Medicine | Admitting: Internal Medicine

## 2020-11-03 ENCOUNTER — Other Ambulatory Visit: Payer: Self-pay | Admitting: Internal Medicine

## 2020-11-03 DIAGNOSIS — E042 Nontoxic multinodular goiter: Secondary | ICD-10-CM

## 2020-12-15 DIAGNOSIS — Z961 Presence of intraocular lens: Secondary | ICD-10-CM | POA: Diagnosis not present

## 2020-12-15 DIAGNOSIS — E119 Type 2 diabetes mellitus without complications: Secondary | ICD-10-CM | POA: Diagnosis not present

## 2021-07-03 DIAGNOSIS — N760 Acute vaginitis: Secondary | ICD-10-CM | POA: Diagnosis not present

## 2021-07-03 DIAGNOSIS — J209 Acute bronchitis, unspecified: Secondary | ICD-10-CM | POA: Diagnosis not present

## 2021-11-16 DIAGNOSIS — Z01419 Encounter for gynecological examination (general) (routine) without abnormal findings: Secondary | ICD-10-CM | POA: Diagnosis not present

## 2021-11-16 DIAGNOSIS — N898 Other specified noninflammatory disorders of vagina: Secondary | ICD-10-CM | POA: Diagnosis not present

## 2021-11-16 DIAGNOSIS — N951 Menopausal and female climacteric states: Secondary | ICD-10-CM | POA: Diagnosis not present

## 2021-11-16 DIAGNOSIS — Z01411 Encounter for gynecological examination (general) (routine) with abnormal findings: Secondary | ICD-10-CM | POA: Diagnosis not present

## 2021-11-16 DIAGNOSIS — Z124 Encounter for screening for malignant neoplasm of cervix: Secondary | ICD-10-CM | POA: Diagnosis not present

## 2021-11-16 DIAGNOSIS — Z6832 Body mass index (BMI) 32.0-32.9, adult: Secondary | ICD-10-CM | POA: Diagnosis not present

## 2021-11-16 DIAGNOSIS — Z1231 Encounter for screening mammogram for malignant neoplasm of breast: Secondary | ICD-10-CM | POA: Diagnosis not present

## 2021-11-16 DIAGNOSIS — F419 Anxiety disorder, unspecified: Secondary | ICD-10-CM | POA: Diagnosis not present

## 2021-12-26 DIAGNOSIS — H16223 Keratoconjunctivitis sicca, not specified as Sjogren's, bilateral: Secondary | ICD-10-CM | POA: Diagnosis not present

## 2021-12-26 DIAGNOSIS — H1045 Other chronic allergic conjunctivitis: Secondary | ICD-10-CM | POA: Diagnosis not present

## 2021-12-26 DIAGNOSIS — E119 Type 2 diabetes mellitus without complications: Secondary | ICD-10-CM | POA: Diagnosis not present

## 2021-12-26 DIAGNOSIS — H5789 Other specified disorders of eye and adnexa: Secondary | ICD-10-CM | POA: Diagnosis not present

## 2022-06-19 ENCOUNTER — Ambulatory Visit: Payer: No Typology Code available for payment source | Admitting: Student

## 2022-06-27 NOTE — Progress Notes (Unsigned)
Subjective:  Patient ID: Allison Lopez, female    DOB: 1961-05-17, 61 y.o.   MRN: [id number]  CC: New Patient  HPI:  Allison Lopez is a very pleasant 61 y.o. female who presents today to establish care.  Type {Blank single:19197::"1","2"} Diabetes: Last three A1C's below. Home medications include: ***. {Blank single:19197::"Does","Does not"} endorse compliance. Notes CBGs range ***. Denies hypoglycemia, polyuria, polydipsia.   Most recent A1Cs: No results found for: "HGBA1C" Last Microalbumin, LDL, Creatinine:No results found for: "GLUF", "MICROALBUR", "LDLCALC", "CREATININE"  Patient {rwisisnot:24883} up to date on diabetic eye. Patient {rwisisnot:24883} up to date on diabetic foot exam.  Multinodular goiter with total thyroidectomy on 2014.   PMHx: Past Medical History:  Diagnosis Date   Diabetes mellitus without complication (HCC)     Surgical Hx: Past Surgical History:  Procedure Laterality Date   KNEE SURGERY     TUBAL LIGATION      Family Hx: Family History  Problem Relation Age of Onset   Cancer Maternal Aunt        breast   Cancer Paternal Uncle        throat    Social Hx: Current Social History   Who lives at home: *** 06/27/2022  Who would speak for you about health care matters: *** 06/27/2022  Transportation: *** 06/27/2022 Important Relationships & Pets: *** 06/27/2022  Current Stressors: *** 06/27/2022 Work / Education:  *** 06/27/2022 Religious / Personal Beliefs: *** 06/27/2022 Interests / Fun: *** 06/27/2022 Other: *** 06/27/2022   Medications:   ROS: Woman:  Patient reports no  vision/ hearing changes,anorexia, weight change, fever ,adenopathy, persistant / recurrent hoarseness, swallowing issues, chest pain, edema,persistant / recurrent cough, hemoptysis, dyspnea(rest, exertional, paroxysmal nocturnal), gastrointestinal  bleeding (melena, rectal bleeding), abdominal pain, excessive heart burn, GU symptoms(dysuria, hematuria, pyuria,  voiding/incontinence  Issues) syncope, focal weakness, severe memory loss, concerning skin lesions, depression, anxiety, abnormal bruising/bleeding, major joint swelling, breast masses or abnormal vaginal bleeding.    Man:  Patient reports no  vision/ hearing changes,anorexia, weight change, fever ,adenopathy, persistant / recurrent hoarseness, swallowing issues, chest pain, edema,persistant / recurrent cough, hemoptysis, dyspnea(rest, exertional, paroxysmal nocturnal), gastrointestinal  bleeding (melena, rectal bleeding), abdominal pain, excessive heart burn, GU symptoms(dysuria, hematuria, pyuria, voiding/incontinence  Issues) syncope, focal weakness, severe memory loss, concerning skin lesions, depression, anxiety, abnormal bruising/bleeding, major joint swelling.    Preventative Screening Colonoscopy: year*** results *** Mammogram: year*** results *** Pap test: year*** results *** PSA: year*** results *** DEXA: year*** results *** Tetanus vaccine: year*** results *** Pneumonia vaccine: year*** results *** Shingles vaccine: year*** results *** Heart stress test: year*** results *** Echocardiogram: year*** results *** Xrays: year*** results *** CT/MRI: year*** results ***  Smoking status reviewed  ROS: pertinent noted in the HPI    Objective:  There were no vitals taken for this visit. Vitals and nursing note reviewed  General: NAD, pleasant, able to participate in exam HEENT: normocephalic, TM's visualized bilaterally, no scleral icterus or conjunctival pallor, no nasal discharge, moist mucous membranes, good dentition without erythema or discharge noted in posterior oropharynx Neck: supple, non-tender, without lymphadenopathy Cardiac: RRR, S1 S2 present. normal heart sounds, no murmurs. Respiratory: CTAB, normal effort, No wheezes, rales or rhonchi Abdomen: Normoactive bowel sounds, non-tender, non-distended, no hepatosplenomegaly Extremities: no edema or cyanosis. Skin: warm  and dry, no rashes noted Neuro: alert, no obvious focal deficits Psych: Normal affect and mood  Assessment & Plan:  No problem-specific Assessment & Plan notes found for this encounter.  No orders of the defined types were placed in this encounter.  No orders of the defined types were placed in this encounter.  No follow-ups on file. Alfredo Martinez, MD PGY-2 Family Medicine

## 2022-06-27 NOTE — Patient Instructions (Incomplete)
It was great to see you today! Thank you for choosing [facility] for your primary care. Allison Lopez was seen for new visit  Today we addressed: You need a mammogram to prevent breast cancer.  Please schedule an appointment.  You can call [phone].    Schedule your colonoscopy to help detect colon cancer. The stomach doctors will call to schedule an appt with you. If you decide against this, please ask about the cologuard as an option.  You also need a pap smear, we can schedule this here at clinic **  If you haven't already, sign up for My Chart to have easy access to your labs results, and communication with your primary care physician.  We are checking some labs today. If they are abnormal, I will call you. If they are normal, I will send you a MyChart message (if it is active) or a letter in the mail. If you do not hear about your labs in the next 2 weeks, please call the office.   You should return to our clinic No follow-ups on file.  I recommend that you always bring your medications to each appointment as this makes it easy to ensure you are on the correct medications and helps [place] not miss refills when you need them.  Please arrive 15 minutes before your appointment to ensure smooth check in process.  We appreciate your efforts in making this happen.  Please call the clinic at [phone] if your symptoms worsen or you have any concerns.  Thank you for allowing me to participate in your care, Alfredo Martinez, MD PGY-2 Family Medicine

## 2022-06-28 ENCOUNTER — Encounter: Payer: Self-pay | Admitting: Student

## 2022-06-28 ENCOUNTER — Ambulatory Visit (INDEPENDENT_AMBULATORY_CARE_PROVIDER_SITE_OTHER): Payer: No Typology Code available for payment source | Admitting: Student

## 2022-06-28 VITALS — BP 166/95 | HR 84 | Ht 66.0 in | Wt 213.6 lb

## 2022-06-28 DIAGNOSIS — R030 Elevated blood-pressure reading, without diagnosis of hypertension: Secondary | ICD-10-CM

## 2022-06-28 DIAGNOSIS — R4589 Other symptoms and signs involving emotional state: Secondary | ICD-10-CM

## 2022-06-28 DIAGNOSIS — E119 Type 2 diabetes mellitus without complications: Secondary | ICD-10-CM | POA: Insufficient documentation

## 2022-06-28 DIAGNOSIS — E042 Nontoxic multinodular goiter: Secondary | ICD-10-CM

## 2022-06-28 DIAGNOSIS — Z0000 Encounter for general adult medical examination without abnormal findings: Secondary | ICD-10-CM | POA: Insufficient documentation

## 2022-06-28 DIAGNOSIS — K5900 Constipation, unspecified: Secondary | ICD-10-CM | POA: Insufficient documentation

## 2022-06-28 DIAGNOSIS — I10 Essential (primary) hypertension: Secondary | ICD-10-CM | POA: Insufficient documentation

## 2022-06-28 LAB — POCT GLYCOSYLATED HEMOGLOBIN (HGB A1C): HbA1c, POC (controlled diabetic range): 9.5 % — AB (ref 0.0–7.0)

## 2022-06-28 MED ORDER — POLYETHYLENE GLYCOL 3350 17 GM/SCOOP PO POWD: 17.0000 g | Freq: Two times a day (BID) | ORAL | 1 refills | Status: DC | PRN

## 2022-06-28 NOTE — Assessment & Plan Note (Signed)
Intermittent constipation, ordered Dulcolax to pharmacy.

## 2022-06-28 NOTE — Assessment & Plan Note (Signed)
Previously saw Dr. Sharl Ma with [facility] endocrinology.  Has had recurrent thyroid ultrasounds yearly in the past and biopsied per TI RADS scoring, were benign.  I do not have an updated TSH, will order TSH with reflex T4 to see where the patient is at currently.  Not on medications for thyroid and is asymptomatic now.

## 2022-06-28 NOTE — Assessment & Plan Note (Signed)
Partially related to environmental stresses that she has been going through in her life. No thoughts of SI or HI She has previously been on Lexapro but is not taking that currently, we will discuss this with her in 3 weeks and will likely start her on an SSRI to assist with mood.

## 2022-06-28 NOTE — Assessment & Plan Note (Signed)
Continue with metformin and long-acting insulin 30 units at night. A1c today as well as well as BMP Consider Ozempic starting dose once A1c results Patient amenable to this We will determine how to alter current medication regimen as well  Denies symptoms of hypoglycemia, polyuria, polydipsia, numbness extremities, foot ulcers/trauma Obtained signature for information for Dr. Laruth Bouchard office for eye exam and will provide foot exam at next visit. May need microalbumin as well

## 2022-06-28 NOTE — Assessment & Plan Note (Signed)
Per patient, up-to-date on colonoscopy, unsure of GI physician who performed this but will attempt to get records Up-to-date on Pap smear as well as mammogram, has had these performed by Dr. Algie Coffer, will attempt to get records We will attempt to get records for eye exam with Dr. Laruth Bouchard office Obtaining lipid panel today to check cholesterol levels

## 2022-06-28 NOTE — Assessment & Plan Note (Signed)
Patient reports previous history of hypertension and was taking lisinopril 5 mg but is no longer taking this Elevated pressures x2 today We will recheck in 3 weeks and likely start on an ARB for kidney protection. BMP today

## 2022-06-29 LAB — BASIC METABOLIC PANEL
BUN/Creatinine Ratio: 13 (ref 9–23)
BUN: 14 mg/dL (ref 6–24)
CO2: 24 mmol/L (ref 20–29)
Calcium: 9.3 mg/dL (ref 8.7–10.2)
Chloride: 103 mmol/L (ref 96–106)
Creatinine, Ser: 1.09 mg/dL — ABNORMAL HIGH (ref 0.57–1.00)
Glucose: 225 mg/dL — ABNORMAL HIGH (ref 70–99)
Potassium: 3.7 mmol/L (ref 3.5–5.2)
Sodium: 141 mmol/L (ref 134–144)
eGFR: 59 mL/min/{1.73_m2} — ABNORMAL LOW (ref >59)

## 2022-06-29 LAB — LIPID PANEL
Chol/HDL Ratio: 4.1 ratio (ref 0.0–4.4)
Cholesterol, Total: 206 mg/dL — ABNORMAL HIGH (ref 100–199)
HDL: 50 mg/dL (ref >39)
LDL Chol Calc (NIH): 131 mg/dL — ABNORMAL HIGH (ref 0–99)
Triglycerides: 140 mg/dL (ref 0–149)
VLDL Cholesterol Cal: 25 mg/dL (ref 5–40)

## 2022-06-29 LAB — TSH RFX ON ABNORMAL TO FREE T4: TSH: 2.01 u[IU]/mL (ref 0.450–4.500)

## 2022-06-30 ENCOUNTER — Other Ambulatory Visit: Payer: Self-pay | Admitting: Student

## 2022-06-30 DIAGNOSIS — E7800 Pure hypercholesterolemia, unspecified: Secondary | ICD-10-CM

## 2022-06-30 DIAGNOSIS — E119 Type 2 diabetes mellitus without complications: Secondary | ICD-10-CM

## 2022-06-30 MED ORDER — ATORVASTATIN CALCIUM 40 MG PO TABS: 40.0000 mg | ORAL_TABLET | Freq: Every day | ORAL | 3 refills | Status: DC

## 2022-06-30 MED ORDER — SEMAGLUTIDE(0.25 OR 0.5MG/DOS) 2 MG/1.5ML ~~LOC~~ SOPN: 0.2500 mg | PEN_INJECTOR | SUBCUTANEOUS | 0 refills | Status: DC

## 2022-06-30 NOTE — Progress Notes (Signed)
D/c Metformin, continue with Ozempic and Insulin 30 U LA.  Statin ordered  Called and informed patient

## 2022-07-13 NOTE — Patient Instructions (Addendum)
It was great to see you today! Thank you for choosing [facility] for your primary care. Allison Lopez was seen for .  Today we addressed:  Diabetes Your diabetes is moderately controlled Your goal is to have an A1c < 7 Medicine Changes: Increase Ozempic 0.5 mg  Homework: Fast   Call us if: you experience episodes of hypoglycemia   Please call if you are unable to obtain your lantus pens  Let me know what monitor you have  Continue with Losartan 50 mg daily   We will continue with trying to obtain records from your other doctors   If you haven't already, sign up for My Chart to have easy access to your labs results, and communication with your primary care physician.  You should return to our clinic Return in about 2 months (around 09/13/2022) for DM2, HTN .  I recommend that you always bring your medications to each appointment as this makes it easy to ensure you are on the correct medications and helps [place] not miss refills when you need them.  Please arrive 15 minutes before your appointment to ensure smooth check in process.  We appreciate your efforts in making this happen.  Please call the clinic at [phone] if your symptoms worsen or you have any concerns.  Thank you for allowing me to participate in your care, Alfredo Martinez, MD 07/14/2022, 9:15 AM PGY-2, [facility]

## 2022-07-14 ENCOUNTER — Encounter: Payer: Self-pay | Admitting: Student

## 2022-07-14 ENCOUNTER — Ambulatory Visit (INDEPENDENT_AMBULATORY_CARE_PROVIDER_SITE_OTHER): Payer: 59 | Admitting: Student

## 2022-07-14 VITALS — BP 167/100 | HR 90 | Ht 66.0 in | Wt 210.1 lb

## 2022-07-14 DIAGNOSIS — K219 Gastro-esophageal reflux disease without esophagitis: Secondary | ICD-10-CM | POA: Insufficient documentation

## 2022-07-14 DIAGNOSIS — R030 Elevated blood-pressure reading, without diagnosis of hypertension: Secondary | ICD-10-CM

## 2022-07-14 DIAGNOSIS — E119 Type 2 diabetes mellitus without complications: Secondary | ICD-10-CM | POA: Diagnosis not present

## 2022-07-14 DIAGNOSIS — Z860100 Personal history of colon polyps, unspecified: Secondary | ICD-10-CM | POA: Insufficient documentation

## 2022-07-14 DIAGNOSIS — Z0000 Encounter for general adult medical examination without abnormal findings: Secondary | ICD-10-CM

## 2022-07-14 DIAGNOSIS — Z1211 Encounter for screening for malignant neoplasm of colon: Secondary | ICD-10-CM | POA: Insufficient documentation

## 2022-07-14 DIAGNOSIS — I10 Essential (primary) hypertension: Secondary | ICD-10-CM

## 2022-07-14 MED ORDER — INSULIN GLARGINE 100 UNIT/ML SOLOSTAR PEN: 30.0000 [IU] | PEN_INJECTOR | SUBCUTANEOUS | 11 refills | Status: DC

## 2022-07-14 MED ORDER — GLUCOSE BLOOD VI STRP: ORAL_STRIP | 12 refills | Status: DC

## 2022-07-14 MED ORDER — SEMAGLUTIDE(0.25 OR 0.5MG/DOS) 2 MG/3ML ~~LOC~~ SOPN: 0.5000 mg | PEN_INJECTOR | SUBCUTANEOUS | 0 refills | Status: DC

## 2022-07-14 MED ORDER — LOSARTAN POTASSIUM 50 MG PO TABS: 50.0000 mg | ORAL_TABLET | Freq: Every day | ORAL | 3 refills | Status: DC

## 2022-07-14 NOTE — Assessment & Plan Note (Addendum)
We have discontinued metformin, continue with increase Ozempic dose to 0.5 mg weekly LAI 30 U patient is concerned that Lantus might not be covered by insurance, will speak to [place] about this but sent a refill in. Patient requests new blood glucose monitor but is unsure of the type and need strips, she will call us with her monitor types that I can place the order. Follow-up in 2 months for repeat A1c, will likely need increase in Ozempic dosage in 1 month if she tolerates well. Microalbumin and creatinine ratio pended for future visit, patient to return Monday to complete test. Continue atorvastatin 40.

## 2022-07-14 NOTE — Assessment & Plan Note (Signed)
Elevated blood pressure x 2 in clinic.  Was previously on lisinopril per patient. Will continue with losartan 50 Mg daily to start and we will recheck blood pressure on next visit. Up-to-date on BMP, GFR 59 consider SGLT2 in the future if indicated.

## 2022-07-14 NOTE — Assessment & Plan Note (Signed)
Still awaiting records as noted in H&P Foot exam performed today, up-to-date on eye exam but awaiting records from Dr. Laruth Bouchard office Needs PCV 20 and Tdap but had difficulty with insurance on arrival, will give at next visit.

## 2022-07-14 NOTE — Progress Notes (Signed)
SUBJECTIVE:   CHIEF COMPLAINT / HPI:   Type 2 Diabetes: Last A1C below. Home medications include: Ozempic 0.25 and Insulin 30U LA. Does not endorse compliance. Home glucose monitoring 175-200 after eating.  Unable to obtain nonfasting as her glucose monitor has had low battery.  Most recent A1Cs:  Lab Results  Component Value Date   HGBA1C 9.5 (A) 06/28/2022   Last Microalbumin, LDL, Creatinine: Lab Results  Component Value Date   LDLCALC 131 (H) 06/28/2022   CREATININE 1.09 (H) 06/28/2022    Patient is up to date on diabetic eye. Sees [facility] Patient is not up to date on diabetic foot exam. Started Atorvastatin 40 mg    Healthcare Maintenance  Still awaiting records from OBGYN, Endocrinology, needs to sign release for Dr. Dione Booze -- releases in media tab and signed  Updated Care Gaps per media tab Needs urine microalbumin  Needs HIV and Hep C, Shingrix and foot exam    PERTINENT  PMH / PSH:   Past Medical History:  Diagnosis Date   Diabetes mellitus without complication (HCC)     OBJECTIVE:  BP (!) 167/100   Pulse 90   Ht 5\' 6"  (1.676 m)   Wt 210 lb 2 oz (95.3 kg)   SpO2 98%   BMI 33.92 kg/m   General: NAD, pleasant, able to participate in exam Cardiac: RRR, no murmurs auscultated Respiratory: CTAB, normal WOB Abdomen: soft, non-tender, non-distended, normoactive bowel sounds Extremities: warm and well perfused, no edema or cyanosis Skin: warm and dry, no rashes noted Foot exam: No evidence of ulcerations or abrasions, no color changes and toenails, clean Good proprioception and sensation in the feet Palpable DP pulses Neuro: alert, no obvious focal deficits, speech normal Psych: Normal affect and mood  ASSESSMENT/PLAN:  Diabetes mellitus without complication (HCC) We have discontinued metformin, continue with increase Ozempic dose to 0.5 mg weekly LAI 30 U patient is concerned that Lantus might not be covered by insurance, will speak to  [place] about this but sent a refill in. Patient requests new blood glucose monitor but is unsure of the type and need strips, she will call [facility] with her monitor types that I can place the order. Follow-up in 2 months for repeat A1c, will likely need increase in Ozempic dosage in 1 month if she tolerates well. Microalbumin and creatinine ratio pended for future visit, patient to return Monday to complete test. Continue atorvastatin 40.  Elevated BP without diagnosis of hypertension Elevated blood pressure x 2 in clinic.  Was previously on lisinopril per patient. Will continue with losartan 50 Mg daily to start and we will recheck blood pressure on next visit. Up-to-date on BMP, GFR 59 consider SGLT2 in the future if indicated.  Healthcare maintenance Still awaiting records as noted in H&P Foot exam performed today, up-to-date on eye exam but awaiting records from Dr. Tuesday office Needs PCV 20 and Tdap but had difficulty with insurance on arrival, will give at next visit.   Orders Placed This Encounter  Procedures   Protein / creatinine ratio, urine    Standing Status:   Future    Standing Expiration Date:   07/15/2023   Meds ordered this encounter  Medications   Semaglutide,0.25 or 0.5MG /DOS, 2 MG/3ML SOPN    Sig: Inject 0.5 mg into the skin once a week.    Dispense:  3 mL    Refill:  0   glucose blood test strip    Sig: Use as instructed  Dispense:  100 each    Refill:  12   insulin glargine (LANTUS) 100 UNIT/ML Solostar Pen    Sig: Inject 30 Units into the skin every morning.    Dispense:  3 mL    Refill:  11   losartan (COZAAR) 50 MG tablet    Sig: Take 1 tablet (50 mg total) by mouth at bedtime.    Dispense:  90 tablet    Refill:  3   Return in about 2 months (around 09/13/2022) for DM2, HTN . Alfredo Martinez, MD 07/14/2022, 10:07 AM PGY-2, [facility]

## 2022-08-11 ENCOUNTER — Telehealth: Payer: Self-pay | Admitting: Student

## 2022-08-11 ENCOUNTER — Other Ambulatory Visit: Payer: Self-pay | Admitting: Student

## 2022-08-11 DIAGNOSIS — E119 Type 2 diabetes mellitus without complications: Secondary | ICD-10-CM

## 2022-08-11 MED ORDER — SEMAGLUTIDE(0.25 OR 0.5MG/DOS) 2 MG/3ML ~~LOC~~ SOPN: 0.5000 mg | PEN_INJECTOR | SUBCUTANEOUS | 0 refills | Status: DC

## 2022-08-11 NOTE — Telephone Encounter (Signed)
Patient walked in to request refill of:  Name of Medication(s):  Ozempic Last date of OV:  07/14/22 Pharmacy:  Same   Patient states her Ozempic pen is out and will not have any for Monday.   Will route refill request to Clinic RN.  Discussed with patient policy to call pharmacy for future refills.  Also, discussed refills may take up to 48 hours to approve or deny.  Vilinda Blanks

## 2022-09-12 DIAGNOSIS — R1310 Dysphagia, unspecified: Secondary | ICD-10-CM | POA: Diagnosis not present

## 2022-09-12 DIAGNOSIS — R635 Abnormal weight gain: Secondary | ICD-10-CM | POA: Diagnosis not present

## 2022-09-12 DIAGNOSIS — Z1211 Encounter for screening for malignant neoplasm of colon: Secondary | ICD-10-CM | POA: Diagnosis not present

## 2022-09-12 DIAGNOSIS — K5904 Chronic idiopathic constipation: Secondary | ICD-10-CM | POA: Diagnosis not present

## 2022-09-12 DIAGNOSIS — Z86010 Personal history of colonic polyps: Secondary | ICD-10-CM | POA: Diagnosis not present

## 2022-09-20 ENCOUNTER — Encounter: Payer: Self-pay | Admitting: Student

## 2022-09-20 ENCOUNTER — Ambulatory Visit (INDEPENDENT_AMBULATORY_CARE_PROVIDER_SITE_OTHER): Payer: BC Managed Care – PPO | Admitting: Student

## 2022-09-20 VITALS — BP 155/102 | HR 96 | Ht 66.0 in | Wt 210.1 lb

## 2022-09-20 DIAGNOSIS — Z23 Encounter for immunization: Secondary | ICD-10-CM

## 2022-09-20 DIAGNOSIS — E042 Nontoxic multinodular goiter: Secondary | ICD-10-CM | POA: Diagnosis not present

## 2022-09-20 DIAGNOSIS — R4589 Other symptoms and signs involving emotional state: Secondary | ICD-10-CM

## 2022-09-20 DIAGNOSIS — I10 Essential (primary) hypertension: Secondary | ICD-10-CM

## 2022-09-20 DIAGNOSIS — E119 Type 2 diabetes mellitus without complications: Secondary | ICD-10-CM | POA: Diagnosis not present

## 2022-09-20 LAB — POCT GLYCOSYLATED HEMOGLOBIN (HGB A1C): HbA1c, POC (controlled diabetic range): 9.9 % — AB (ref 0.0–7.0)

## 2022-09-20 MED ORDER — SEMAGLUTIDE (1 MG/DOSE) 4 MG/3ML ~~LOC~~ SOPN: 1.0000 mg | PEN_INJECTOR | SUBCUTANEOUS | 0 refills | Status: DC

## 2022-09-20 MED ORDER — LOSARTAN POTASSIUM 100 MG PO TABS: 100.0000 mg | ORAL_TABLET | Freq: Every day | ORAL | 0 refills | Status: DC

## 2022-09-20 NOTE — Assessment & Plan Note (Signed)
stable - Last A1c:  Lab Results  Component Value Date   HGBA1C 9.9 (A) 09/20/2022   - Medications: Ozempic increased to 1 mg with LAI 30U  - Compliance: Fair  - 1 month follow up for urine microalbumin

## 2022-09-20 NOTE — Assessment & Plan Note (Signed)
Patient has had a change in her symptoms related to her thyroid. Ddx includes benign multinodular goiter, malignancy, thyroiditis, will work up further via U/S and monitor TSH w/ reflex. There is low clinical suspicion for malignancy, but the only method that can distinguish benign from malignant nodules is fine needle aspiration biopsy, will approach this depending on thyroid ultrasound results. Malignant or indeterminate nodules would warrant surgical intervention. Patient has had FNA and has been benign in the past with Dr. Buddy Duty. - Thyroid U/S - TSH with reflex

## 2022-09-20 NOTE — Assessment & Plan Note (Signed)
BP: (!) 155/102 today. Poorly controlled. Goal of <140/90. Continue to work on healthy dietary habits and exercise. Follow up in 1 month. Patient did not take medication this AM. Will increase Losartan.  Medication regimen: Losartan 100 mg

## 2022-09-20 NOTE — Assessment & Plan Note (Signed)
    09/20/2022    8:36 AM 07/14/2022    8:49 AM 06/28/2022    9:39 AM  PHQ9 SCORE ONLY  PHQ-9 Total Score 5 5 14   Patient's depressed mood is understandable given her situation, will follow up in one month. No SI/HI.

## 2022-09-20 NOTE — Progress Notes (Signed)
    SUBJECTIVE:   CHIEF COMPLAINT / HPI:   Type 2 Diabetes: Home medications include: LAI 30U, Ozempic 0.5 mg weekly. Does endorse compliance.   Most recent A1Cs:  Lab Results  Component Value Date   HGBA1C 9.9 (A) 09/20/2022   HGBA1C 9.5 (A) 06/28/2022   Last Microalbumin, LDL, Creatinine: Lab Results  Component Value Date   LDLCALC 131 (H) 06/28/2022   CREATININE 1.09 (H) 06/28/2022    HTN: Takes Losartan 50 mg. UTD on BMP   Patient recently had a stressor to the family, her mother had a stroke last night.  Her mother is currently across the street at the hospital, and the patient reports that she will likely not make it through this hospitalization, per the physicians taking care of her mother at the hospital.  Patient reports that her mother is her best friend and that she sees her all the time and does not know what she is going to do after she passes.  Patient also reports that she has pain in her thyroid especially when using the stairs that is described as burning.  No hyperthyroid or hypothyroid symptoms per patient.  She does report that she feels the goiter has enlarged.  PERTINENT  PMH / PSH:  Past Medical History:  Diagnosis Date   Diabetes mellitus without complication (HCC)      OBJECTIVE:   BP (!) 155/102   Pulse 96   Ht 5\' 6"  (1.676 m)   Wt 210 lb 2 oz (95.3 kg)   SpO2 100%   BMI 33.92 kg/m   General: Alert and oriented; appropriately responsive to questions HEENT: Goiter over thyroid that is NTTP, larger on right  Heart: Regular rate and rhythm with no murmurs appreciated Lungs: CTA bilaterally, no wheezing Abdomen: Bowel sounds present, no abdominal pain Skin: Warm and dry Extremities: No lower extremity edema Psych: Depressed mood    ASSESSMENT/PLAN:   Multinodular goiter (nontoxic) Patient has had a change in her symptoms related to her thyroid. Ddx includes benign multinodular goiter, malignancy, thyroiditis, will work up further via U/S  and monitor TSH w/ reflex. There is low clinical suspicion for malignancy, but the only method that can distinguish benign from malignant nodules is fine needle aspiration biopsy, will approach this depending on thyroid ultrasound results. Malignant or indeterminate nodules would warrant surgical intervention. Patient has had FNA and has been benign in the past with Dr. Buddy Duty. - Thyroid U/S - TSH with reflex  Depressed mood    09/20/2022    8:36 AM 07/14/2022    8:49 AM 06/28/2022    9:39 AM  PHQ9 SCORE ONLY  PHQ-9 Total Score 5 5 14   Patient's depressed mood is understandable given her situation, will follow up in one month. No SI/HI.    HTN (hypertension) BP: (!) 155/102 today. Poorly controlled. Goal of <140/90. Continue to work on healthy dietary habits and exercise. Follow up in 1 month. Patient did not take medication this AM. Will increase Losartan.  Medication regimen: Losartan 100 mg    Diabetes mellitus without complication (HCC) stable - Last A1c:  Lab Results  Component Value Date   HGBA1C 9.9 (A) 09/20/2022   - Medications: Ozempic increased to 1 mg with LAI 30U  - Compliance: Fair  - 1 month follow up for urine microalbumin      Erskine Emery, MD [place]

## 2022-09-20 NOTE — Patient Instructions (Addendum)
It was great to see you today! Thank you for choosing [facility] for your primary care. Allison Lopez was seen for follow up.  Today we addressed: Continue with Ozempic and Losartan, increased your Losartan to pharmacy to 100 mg  Please call if you need anything during this time   Diabetes Your diabetes is stable  Your goal is to have an A1c < 7 Medicine Changes: Increase your Ozempic to 1 mg weekly, I have  Homework: Please spend time with your mom and  Call us if: you experience shortness of breath, chest pain, swelling in the legs, worsening low sugars.    Come back to see [place] in: 4 weeks   Were getting a thyroid ultrasound as well   If you haven't already, sign up for My Chart to have easy access to your labs results, and communication with your primary care physician.  We are checking some labs today. If they are abnormal, I will call you. If they are normal, I will send you a MyChart message (if it is active) or a letter in the mail. If you do not hear about your labs in the next 2 weeks, please call the office. I recommend that you always bring your medications to each appointment as this makes it easy to ensure you are on the correct medications and helps [place] not miss refills when you need them. Call the clinic at [phone] if your symptoms worsen or you have any concerns.  You should return to our clinic Return in about 4 weeks (around 10/18/2022) for DM2, HTN . Please arrive 15 minutes before your appointment to ensure smooth check in process.  We appreciate your efforts in making this happen.  Thank you for allowing me to participate in your care, Erskine Emery, MD 09/20/2022, 9:00 AM PGY-2, [place]

## 2022-09-22 ENCOUNTER — Telehealth: Payer: Self-pay

## 2022-09-22 NOTE — Telephone Encounter (Signed)
Spoke with patient. Informed of U/Sound of Thyroid Oct 24th at [facility]. Patient understood didn't have any questions. Allison Lopez, CMA

## 2022-09-24 ENCOUNTER — Other Ambulatory Visit: Payer: Self-pay | Admitting: Family Medicine

## 2022-09-24 ENCOUNTER — Other Ambulatory Visit: Payer: Self-pay | Admitting: Student

## 2022-09-24 ENCOUNTER — Telehealth: Payer: Self-pay | Admitting: Family Medicine

## 2022-09-24 DIAGNOSIS — E1122 Type 2 diabetes mellitus with diabetic chronic kidney disease: Secondary | ICD-10-CM

## 2022-09-24 DIAGNOSIS — E119 Type 2 diabetes mellitus without complications: Secondary | ICD-10-CM

## 2022-09-24 MED ORDER — INSULIN GLARGINE 100 UNIT/ML SOLOSTAR PEN: 30.0000 [IU] | PEN_INJECTOR | SUBCUTANEOUS | 0 refills | Status: DC

## 2022-09-24 NOTE — Telephone Encounter (Signed)
After-hours/emergency line call  Patient calls after-hours line reports that she has run out of her insulin, normally takes 30 units of Lantus daily.  She took her last dose this morning which was a partial dose.  She went to her pharmacy to pick up her Lantus but was told that they did not have her prescription there and that there was an issue because Dr. Buddy Duty is no longer prescribing the insulin.  I looked into her record and it appears that her PCP has already ordered her Lantus, so I am unsure why she does not have any refills at the pharmacy.  Pharmacy confirmed with patient.  I placed a refill order for Lantus and advised her that if there are still any issues, she should call the office tomorrow morning.

## 2022-09-25 NOTE — Telephone Encounter (Signed)
Patient calls nurse line in regards to to insulin prescription.   Patient reports she takes 30 units of Lantus daily. She reports Semglee was called in this morning. Semglee is not covered by her insurance.   However, when I called the pharmacy to discuss they noted Lantus is not or has been an active medication on her profile. They report she last picked up [facility]. I spoke with patient and she confirmed Soliqua as preferred insulin.   Will forward to PCP.

## 2022-09-26 ENCOUNTER — Telehealth: Payer: Self-pay | Admitting: Student

## 2022-09-26 ENCOUNTER — Ambulatory Visit (HOSPITAL_COMMUNITY)
Admission: RE | Admit: 2022-09-26 | Discharge: 2022-09-26 | Disposition: A | Discharge status: Home / Self Care | Payer: BC Managed Care – PPO | Source: Ambulatory Visit | Attending: Family Medicine | Admitting: Family Medicine

## 2022-09-26 DIAGNOSIS — E042 Nontoxic multinodular goiter: Secondary | ICD-10-CM | POA: Insufficient documentation

## 2022-09-26 MED ORDER — INSULIN GLARGINE 100 UNIT/ML SOLOSTAR PEN: 30.0000 [IU] | PEN_INJECTOR | SUBCUTANEOUS | 0 refills | Status: DC

## 2022-09-26 NOTE — Telephone Encounter (Signed)
Patient came in stating that she has been out of her insulin for 3 days. She states that she has already requested a refill for her insulin, but when she went to the pharmacy they had not received the prescription from [place].

## 2022-09-26 NOTE — Telephone Encounter (Signed)
Spoke to pharmacy, verified filling prescription for Lantus. Discontinuing Semglee order. Will discuss Soliqua (GLP1 + Lantus) with the patient as she is receiving Ozempic separately and had been previously receiving [place] through endocrinology unbeknownst to me.   Therefore, will continue with Lantus 30 U and Ozempic weekly until we have further discussion.

## 2022-09-26 NOTE — Telephone Encounter (Signed)
Contacted the pharmacy to make sure they filled the pt medication the rep stated that they have filled it. Contacted the pt to let her know its ready for pick up.

## 2022-09-27 NOTE — Telephone Encounter (Signed)
Patient unable to obtain Ozempic through her regular pharmacy. Contacted patient to inform her of ability to provide sample.  Medication Samples have been provided to the patient.  Drug name: Ozempic (semaglutide)       Strength: 4mg /74mL        Qty: 1 LOT: [id number]  Exp.Date: 11/02/2024  Dosing instructions: Inject 1 mg weekly  The patient has been instructed regarding the correct time, dose, and frequency of taking this medication, including desired effects and most common side effects.   [name] 10:34 AM 09/27/2022

## 2022-09-28 ENCOUNTER — Encounter: Payer: Self-pay | Admitting: Student

## 2022-09-29 NOTE — Progress Notes (Signed)
Unsure why collected TSH and urine not resulted, reached out to lab personnel for assistance.

## 2022-10-02 ENCOUNTER — Other Ambulatory Visit: Payer: Self-pay | Admitting: Student

## 2022-10-02 ENCOUNTER — Encounter: Payer: Self-pay | Admitting: Student

## 2022-10-02 ENCOUNTER — Telehealth: Payer: Self-pay

## 2022-10-02 DIAGNOSIS — E119 Type 2 diabetes mellitus without complications: Secondary | ICD-10-CM

## 2022-10-02 DIAGNOSIS — E042 Nontoxic multinodular goiter: Secondary | ICD-10-CM

## 2022-10-02 NOTE — Telephone Encounter (Signed)
Ozempic sample given to patient.   Pharmacy reports no stock of medication at this time. She is on the list to have medication ordered when they are able.

## 2022-10-02 NOTE — Progress Notes (Signed)
It appears TSH with reflex and microalbumin not obtained on last visit. Sent letter to schedule lab visit in future and ordered future labs.

## 2022-11-15 ENCOUNTER — Other Ambulatory Visit: Payer: Self-pay | Admitting: Student

## 2022-11-15 DIAGNOSIS — E119 Type 2 diabetes mellitus without complications: Secondary | ICD-10-CM

## 2022-11-15 MED ORDER — SEMAGLUTIDE (1 MG/DOSE) 4 MG/3ML ~~LOC~~ SOPN: 1.0000 mg | PEN_INJECTOR | SUBCUTANEOUS | 0 refills | Status: DC

## 2022-11-24 DIAGNOSIS — Z01419 Encounter for gynecological examination (general) (routine) without abnormal findings: Secondary | ICD-10-CM | POA: Diagnosis not present

## 2022-11-24 DIAGNOSIS — Z1231 Encounter for screening mammogram for malignant neoplasm of breast: Secondary | ICD-10-CM | POA: Diagnosis not present

## 2022-11-24 DIAGNOSIS — Z118 Encounter for screening for other infectious and parasitic diseases: Secondary | ICD-10-CM | POA: Diagnosis not present

## 2022-11-24 DIAGNOSIS — N760 Acute vaginitis: Secondary | ICD-10-CM | POA: Diagnosis not present

## 2022-11-24 DIAGNOSIS — N898 Other specified noninflammatory disorders of vagina: Secondary | ICD-10-CM | POA: Diagnosis not present

## 2022-11-24 DIAGNOSIS — Z6832 Body mass index (BMI) 32.0-32.9, adult: Secondary | ICD-10-CM | POA: Diagnosis not present

## 2022-11-24 DIAGNOSIS — F419 Anxiety disorder, unspecified: Secondary | ICD-10-CM | POA: Diagnosis not present

## 2022-12-18 ENCOUNTER — Other Ambulatory Visit: Payer: Self-pay | Admitting: Student

## 2022-12-18 DIAGNOSIS — E119 Type 2 diabetes mellitus without complications: Secondary | ICD-10-CM

## 2022-12-18 DIAGNOSIS — I10 Essential (primary) hypertension: Secondary | ICD-10-CM

## 2022-12-27 DIAGNOSIS — Z961 Presence of intraocular lens: Secondary | ICD-10-CM | POA: Diagnosis not present

## 2022-12-27 DIAGNOSIS — E113292 Type 2 diabetes mellitus with mild nonproliferative diabetic retinopathy without macular edema, left eye: Secondary | ICD-10-CM | POA: Diagnosis not present

## 2022-12-27 DIAGNOSIS — H40013 Open angle with borderline findings, low risk, bilateral: Secondary | ICD-10-CM | POA: Diagnosis not present

## 2022-12-27 DIAGNOSIS — E113211 Type 2 diabetes mellitus with mild nonproliferative diabetic retinopathy with macular edema, right eye: Secondary | ICD-10-CM | POA: Diagnosis not present

## 2023-01-09 ENCOUNTER — Encounter (INDEPENDENT_AMBULATORY_CARE_PROVIDER_SITE_OTHER): Payer: Self-pay | Admitting: Ophthalmology

## 2023-01-09 ENCOUNTER — Encounter (INDEPENDENT_AMBULATORY_CARE_PROVIDER_SITE_OTHER): Payer: BC Managed Care – PPO | Admitting: Ophthalmology

## 2023-01-09 DIAGNOSIS — I10 Essential (primary) hypertension: Secondary | ICD-10-CM | POA: Diagnosis not present

## 2023-01-09 DIAGNOSIS — H35033 Hypertensive retinopathy, bilateral: Secondary | ICD-10-CM | POA: Diagnosis not present

## 2023-01-09 DIAGNOSIS — H43813 Vitreous degeneration, bilateral: Secondary | ICD-10-CM | POA: Diagnosis not present

## 2023-01-09 DIAGNOSIS — E113313 Type 2 diabetes mellitus with moderate nonproliferative diabetic retinopathy with macular edema, bilateral: Secondary | ICD-10-CM

## 2023-01-24 ENCOUNTER — Encounter (INDEPENDENT_AMBULATORY_CARE_PROVIDER_SITE_OTHER): Payer: BC Managed Care – PPO | Admitting: Ophthalmology

## 2023-01-24 DIAGNOSIS — E113313 Type 2 diabetes mellitus with moderate nonproliferative diabetic retinopathy with macular edema, bilateral: Secondary | ICD-10-CM | POA: Diagnosis not present

## 2023-02-06 ENCOUNTER — Encounter (INDEPENDENT_AMBULATORY_CARE_PROVIDER_SITE_OTHER): Payer: BC Managed Care – PPO | Admitting: Ophthalmology

## 2023-02-06 DIAGNOSIS — H35033 Hypertensive retinopathy, bilateral: Secondary | ICD-10-CM

## 2023-02-06 DIAGNOSIS — H43813 Vitreous degeneration, bilateral: Secondary | ICD-10-CM

## 2023-02-06 DIAGNOSIS — E113313 Type 2 diabetes mellitus with moderate nonproliferative diabetic retinopathy with macular edema, bilateral: Secondary | ICD-10-CM | POA: Diagnosis not present

## 2023-02-06 DIAGNOSIS — I10 Essential (primary) hypertension: Secondary | ICD-10-CM | POA: Diagnosis not present

## 2023-02-07 ENCOUNTER — Ambulatory Visit (INDEPENDENT_AMBULATORY_CARE_PROVIDER_SITE_OTHER): Payer: BC Managed Care – PPO | Admitting: Student

## 2023-02-07 ENCOUNTER — Other Ambulatory Visit (HOSPITAL_COMMUNITY): Payer: Self-pay

## 2023-02-07 ENCOUNTER — Encounter: Payer: Self-pay | Admitting: Student

## 2023-02-07 VITALS — BP 160/65 | HR 80 | Ht 66.0 in | Wt 204.6 lb

## 2023-02-07 DIAGNOSIS — E119 Type 2 diabetes mellitus without complications: Secondary | ICD-10-CM

## 2023-02-07 DIAGNOSIS — Z86010 Personal history of colonic polyps: Secondary | ICD-10-CM | POA: Diagnosis not present

## 2023-02-07 DIAGNOSIS — I10 Essential (primary) hypertension: Secondary | ICD-10-CM | POA: Diagnosis not present

## 2023-02-07 DIAGNOSIS — E042 Nontoxic multinodular goiter: Secondary | ICD-10-CM | POA: Diagnosis not present

## 2023-02-07 DIAGNOSIS — R4589 Other symptoms and signs involving emotional state: Secondary | ICD-10-CM

## 2023-02-07 LAB — POCT GLYCOSYLATED HEMOGLOBIN (HGB A1C): HbA1c, POC (controlled diabetic range): 7.9 % — AB (ref 0.0–7.0)

## 2023-02-07 MED ORDER — BASAGLAR KWIKPEN 100 UNIT/ML ~~LOC~~ SOPN: 30.0000 [IU] | PEN_INJECTOR | Freq: Every day | SUBCUTANEOUS | 3 refills | Status: DC

## 2023-02-07 MED ORDER — AMLODIPINE BESYLATE 5 MG PO TABS: 5.0000 mg | ORAL_TABLET | Freq: Every day | ORAL | 3 refills | Status: DC

## 2023-02-07 NOTE — Progress Notes (Signed)
SUBJECTIVE:   CHIEF COMPLAINT / HPI:   Type 2 Diabetes: Home medications include: Ozempic 1 mg and LAI 30 U.  Patient had significant difficulty obtaining her Ozempic from the pharmacy, had difficulty having insurance pay for the medication.  Unsure what happened in that process.  Patient has been using expired insulin at home but not GLP1.  Home glucose monitoring: 170s before her mom passed away  Most recent A1Cs:  Lab Results  Component Value Date   HGBA1C 7.9 (A) 02/07/2023   HGBA1C 9.9 (A) 09/20/2022   Last Microalbumin, LDL, Creatinine: Lab Results  Component Value Date   LDLCALC 131 (H) 06/28/2022   CREATININE 1.09 (H) 06/28/2022    Patient is up to date on diabetic eye. Has diabetic retinopathy, sees a diabetic specialist regularly for injections  Patient is up to date on diabetic foot exam.  Hypertension: BP: (!) 160/65 today. Home medications include: Losartan 100 mg. She endorses taking these medications as prescribed. Does not check blood pressure at home. Most recent creatinine trend:  Lab Results  Component Value Date   CREATININE 1.09 (H) 06/28/2022   Patient has had a BMP in the past 1 year.   Mom recently passed away shortly after her last visit with me.  It was a very difficult time for the patient but she is currently in therapy.     02/07/2023    8:33 AM 09/20/2022    8:36 AM 07/14/2022    8:49 AM  PHQ9 SCORE ONLY  PHQ-9 Total Score '7 5 5     '$ PERTINENT  PMH / PSH:  Colonic polyp history  Goiter--biopsied in past and benign nodules  HTN GERD DM2  OBJECTIVE:  BP (!) 160/65   Pulse 80   Ht '5\' 6"'$  (1.676 m)   Wt 204 lb 9.6 oz (92.8 kg)   SpO2 99%   BMI 33.02 kg/m  Physical Exam  General: Alert and oriented in no apparent distress Heart: Regular rate and rhythm with no murmurs appreciated Lungs: Normal WOB Abdomen: no abdominal pain Skin: Warm and dry Extremities: No lower extremity edema   Thyroid U/S 09/26/22 IMPRESSION: 1. Similar  findings of thyromegaly and multinodular goiter. 2. Previously biopsied dominant right-sided thyroid nodules/masses have increased in size compared to remote examination performed in 2011 with nodule labeled #1 now measuring 4.6 cm, previously, 3.1 cm, and nodule labeled #2 now measuring 5.4 cm, previously 3.0 cm, likely explaining the patient's subjective complaint worsening thyromegaly. Correlation with previous biopsy results is advised. Assuming a benign pathologic diagnosis, repeat sampling and/or continued dedicated follow-up is not recommended, even in spite of interval growth as benign nodules can demonstrate growth.  ASSESSMENT/PLAN:  Diabetes mellitus without complication ([place]) Assessment & Plan: improved - Last A1c:  Lab Results  Component Value Date   HGBA1C 7.9 (A) 02/07/2023   - Medications: LAI Basaglar 30 U (gave voucher for [employer] for her to obtain at the pharmacy).  I will route the patient's note to [facility] for assistance with GLP-1 coverage and long-acting insulin coverage as the patient has difficulty with obtaining both through her insurance. - Compliance: Good   Orders: -     Microalbumin / creatinine urine ratio -     POCT glycosylated hemoglobin (Hb A1C) -     Basaglar KwikPen; Inject 30 Units into the skin daily.  Dispense: 15 mL; Refill: 3  Hypertension, unspecified type Assessment & Plan: BP: (!) 160/65 today.  elevated . Continue to work on healthy dietary habits  and exercise. Follow up in 3 months.   Medication regimen: Added Amlodipine '5mg'$ , Losartan 100 mg    Orders: -     amLODIPine Besylate; Take 1 tablet (5 mg total) by mouth at bedtime.  Dispense: 90 tablet; Refill: 3  Multinodular goiter (nontoxic) Assessment & Plan: See U/S results above. Asymptomatic. Hold on UTD labs until next visit.    Personal history of colonic polyps Assessment & Plan: Has colonoscopy scheduled for April, see care everywhere   Depressed mood Assessment &  Plan: Based on social stressors as noted above, sees a therapist regularly.    Return in about 3 months (around 05/10/2023). Erskine Emery, MD 02/07/2023, 9:08 AM PGY-2, [place]

## 2023-02-07 NOTE — Assessment & Plan Note (Signed)
Has colonoscopy scheduled for April, see care everywhere

## 2023-02-07 NOTE — Patient Instructions (Addendum)
It was great to see you today! Thank you for choosing [facility] for your primary care. Allison Lopez was seen for follow up.  Today we addressed: -I will start you on Basaglar 30 Units  -Our pharmacy technician will give you a call about the Ozempic coverage  -Keep taking your losartan  -I will start you on another medication called amlodipine 5 mg for your blood pressure  -Please call me if you do not get a call from [place] in 3-4 days  -Call if they do not give you your insulin   If you haven't already, sign up for My Chart to have easy access to your labs results, and communication with your primary care physician.  I recommend that you always bring your medications to each appointment as this makes it easy to ensure you are on the correct medications and helps [place] not miss refills when you need them. Call the clinic at [phone] if your symptoms worsen or you have any concerns.  You should return to our clinic Return in about 3 months (around 05/10/2023). Please arrive 15 minutes before your appointment to ensure smooth check in process.  We appreciate your efforts in making this happen.  Thank you for allowing me to participate in your care, Erskine Emery, MD 02/07/2023, 9:03 AM PGY-2, [place]

## 2023-02-07 NOTE — Assessment & Plan Note (Signed)
Based on social stressors as noted above, sees a therapist regularly.

## 2023-02-07 NOTE — Assessment & Plan Note (Signed)
BP: (!) 160/65 today.  elevated . Continue to work on healthy dietary habits and exercise. Follow up in 3 months.   Medication regimen: Added Amlodipine '5mg'$ , Losartan 100 mg

## 2023-02-07 NOTE — Assessment & Plan Note (Signed)
See U/S results above. Asymptomatic. Hold on UTD labs until next visit.

## 2023-02-07 NOTE — Assessment & Plan Note (Signed)
improved - Last A1c:  Lab Results  Component Value Date   HGBA1C 7.9 (A) 02/07/2023   - Medications: LAI Basaglar 30 U (gave voucher for [employer] for her to obtain at the pharmacy).  I will route the patient's note to [facility] for assistance with GLP-1 coverage and long-acting insulin coverage as the patient has difficulty with obtaining both through her insurance. - Compliance: Good

## 2023-02-19 ENCOUNTER — Telehealth: Payer: Self-pay | Admitting: Student

## 2023-02-19 DIAGNOSIS — E119 Type 2 diabetes mellitus without complications: Secondary | ICD-10-CM

## 2023-02-19 NOTE — Telephone Encounter (Signed)
Patient walked in stating the BAS insulin voucher was present to the pharmacy who accepted it but said they are unable to get in contact with the company to put the code in for the discount.  She is out of insulin and want to know what she can do to get more insulin.  Please call her [phone]

## 2023-02-19 NOTE — Telephone Encounter (Signed)
Routed message to PCP. Ranesha Val, CMA  

## 2023-02-21 MED ORDER — SEMAGLUTIDE (1 MG/DOSE) 4 MG/3ML ~~LOC~~ SOPN: 1.0000 mg | PEN_INJECTOR | SUBCUTANEOUS | 6 refills | Status: DC

## 2023-02-21 NOTE — Telephone Encounter (Signed)
Contacted patient and determined she was unable to get her insulin and unable to get her Ozempic due to cost.  When we attempted to use a 1 time voucher for her insulin glargine (basaglar) the voucher system was not working.   Patient reports limited finances and inability to pay significantly expensive medication costs.   Contacted pharmacy and asked them to attempt a retrial of the voucher.  Unfortunately the voucher system is still not functional.   The pharmacist then attempted to run claim for basal insulin 30 units and the pharmacist shared that it was now covered at $0 copay.  He did not have any valid prescription for Ozempic but is willing to process claim as the 1mg  dose is available today.   New prescription for Ozempic (semaglutide) 1mg  sent to pharmacy.    Communicated with patient that she should anticipate insulin availability today and possibly Ozempic as well.  I asked her to call our office if both medications were not available to her in the next 24-48 hours.  She will restart, previously effective doses of both insulin glargine and Ozempic (semaglutide).

## 2023-02-22 NOTE — Telephone Encounter (Signed)
Reviewed and agree with Dr Koval's plan.   

## 2023-02-23 ENCOUNTER — Other Ambulatory Visit (HOSPITAL_COMMUNITY): Payer: Self-pay

## 2023-02-23 NOTE — Telephone Encounter (Signed)
Patient returns call to nurse line. She states that insulin would still cost her $900 with voucher. She is asking if there is an alternative she could be prescribed.   Will forward to PCP and Dr. Valentina Lucks.   Talbot Grumbling, RN

## 2023-02-23 NOTE — Telephone Encounter (Signed)
Reached out to pharmacy regarding patients copay issues.   Patient picked up basaglar 02/22/23 for $0 for 30 day supply.   Spoke with patient to clarify which medication she picked up. Patient doubled checked that she rec'd insulin (basaglar) and had no copay. She is aware of how to take it. Patient is also aware her copay for ozempic is $900+ and that I would reach back out to her provider about this in the meantime.   She currently has Pharmacist, community and wouldn't qualify for patient assistance for ozempic, just copay cards (which offer a maximum savings of $150).

## 2023-03-06 ENCOUNTER — Encounter (INDEPENDENT_AMBULATORY_CARE_PROVIDER_SITE_OTHER): Payer: BC Managed Care – PPO | Admitting: Ophthalmology

## 2023-03-08 ENCOUNTER — Encounter (INDEPENDENT_AMBULATORY_CARE_PROVIDER_SITE_OTHER): Payer: BC Managed Care – PPO | Admitting: Ophthalmology

## 2023-03-08 DIAGNOSIS — H43813 Vitreous degeneration, bilateral: Secondary | ICD-10-CM | POA: Diagnosis not present

## 2023-03-08 DIAGNOSIS — H35033 Hypertensive retinopathy, bilateral: Secondary | ICD-10-CM | POA: Diagnosis not present

## 2023-03-08 DIAGNOSIS — I10 Essential (primary) hypertension: Secondary | ICD-10-CM | POA: Diagnosis not present

## 2023-03-08 DIAGNOSIS — E113313 Type 2 diabetes mellitus with moderate nonproliferative diabetic retinopathy with macular edema, bilateral: Secondary | ICD-10-CM | POA: Diagnosis not present

## 2023-03-13 ENCOUNTER — Other Ambulatory Visit (HOSPITAL_COMMUNITY): Payer: Self-pay

## 2023-03-14 MED ORDER — TRULICITY 0.75 MG/0.5ML ~~LOC~~ SOAJ: 0.7500 mg | SUBCUTANEOUS | 6 refills | Status: DC

## 2023-03-14 NOTE — Telephone Encounter (Signed)
Contacted patient to share news of medication coverage.   Patient reports blood glucose readings improved but remain in the 200s.   She is taking her insulin.   Discussed coverage of Trulicty (dulaglutide) and $0 copay. Patient educated on purpose, proper use and potential adverse effects of nausea.  Following instruction patient verbalized understanding of treatment plan.   [facility] prescription [facility] to [facility].   She verbalized understanding of once weekly dosing and treatment plan.  She thanked me for the good news.

## 2023-03-14 NOTE — Addendum Note (Signed)
Addended by: Kathrin Ruddy on: 03/14/2023 04:43 PM   Modules accepted: Orders

## 2023-03-15 ENCOUNTER — Other Ambulatory Visit: Payer: Self-pay | Admitting: Student

## 2023-03-15 DIAGNOSIS — I10 Essential (primary) hypertension: Secondary | ICD-10-CM

## 2023-03-15 DIAGNOSIS — E119 Type 2 diabetes mellitus without complications: Secondary | ICD-10-CM

## 2023-03-16 ENCOUNTER — Telehealth: Payer: Self-pay

## 2023-03-16 MED ORDER — PEN NEEDLES 31G X 5 MM MISC: 1.0000 | Freq: Three times a day (TID) | 0 refills | Status: DC

## 2023-03-16 NOTE — Telephone Encounter (Signed)
Patient calls nurse line requesting prescription for pen needles.   This is not on current medication list. [facility] [address].   Veronda Prude, RN

## 2023-03-19 NOTE — Telephone Encounter (Signed)
Reviewed and agree with Dr Koval's plan.   

## 2023-03-21 ENCOUNTER — Encounter (INDEPENDENT_AMBULATORY_CARE_PROVIDER_SITE_OTHER): Payer: BC Managed Care – PPO | Admitting: Ophthalmology

## 2023-03-22 ENCOUNTER — Other Ambulatory Visit: Payer: Self-pay | Admitting: Student

## 2023-04-06 ENCOUNTER — Encounter (INDEPENDENT_AMBULATORY_CARE_PROVIDER_SITE_OTHER): Payer: BC Managed Care – PPO | Admitting: Ophthalmology

## 2023-04-09 ENCOUNTER — Encounter (INDEPENDENT_AMBULATORY_CARE_PROVIDER_SITE_OTHER): Payer: BC Managed Care – PPO | Admitting: Ophthalmology

## 2023-04-09 DIAGNOSIS — I10 Essential (primary) hypertension: Secondary | ICD-10-CM

## 2023-04-09 DIAGNOSIS — Z7985 Long-term (current) use of injectable non-insulin antidiabetic drugs: Secondary | ICD-10-CM

## 2023-04-09 DIAGNOSIS — E113313 Type 2 diabetes mellitus with moderate nonproliferative diabetic retinopathy with macular edema, bilateral: Secondary | ICD-10-CM

## 2023-04-09 DIAGNOSIS — H43813 Vitreous degeneration, bilateral: Secondary | ICD-10-CM

## 2023-04-09 DIAGNOSIS — H35033 Hypertensive retinopathy, bilateral: Secondary | ICD-10-CM

## 2023-05-07 ENCOUNTER — Encounter (INDEPENDENT_AMBULATORY_CARE_PROVIDER_SITE_OTHER): Payer: BC Managed Care – PPO | Admitting: Ophthalmology

## 2023-05-07 DIAGNOSIS — I10 Essential (primary) hypertension: Secondary | ICD-10-CM | POA: Diagnosis not present

## 2023-05-07 DIAGNOSIS — H43813 Vitreous degeneration, bilateral: Secondary | ICD-10-CM

## 2023-05-07 DIAGNOSIS — H35033 Hypertensive retinopathy, bilateral: Secondary | ICD-10-CM

## 2023-05-07 DIAGNOSIS — E113313 Type 2 diabetes mellitus with moderate nonproliferative diabetic retinopathy with macular edema, bilateral: Secondary | ICD-10-CM

## 2023-05-07 DIAGNOSIS — Z7985 Long-term (current) use of injectable non-insulin antidiabetic drugs: Secondary | ICD-10-CM

## 2023-05-21 ENCOUNTER — Telehealth: Payer: Self-pay | Admitting: Student

## 2023-05-21 MED ORDER — ONETOUCH DELICA LANCING DEV MISC: 0 refills | Status: DC

## 2023-05-21 MED ORDER — ONETOUCH VERIO W/DEVICE KIT: PACK | 0 refills | Status: AC

## 2023-05-21 MED ORDER — ONETOUCH DELICA LANCETS 33G MISC: 3 refills | Status: AC

## 2023-05-21 MED ORDER — ONETOUCH VERIO VI STRP: ORAL_STRIP | 3 refills | Status: AC

## 2023-05-21 NOTE — Telephone Encounter (Signed)
Requesting a prescription for a glucose monitor.  One touch - Same pharmacy

## 2023-05-22 NOTE — Telephone Encounter (Signed)
Informed patient that her glucose monitor was sent to the pharmacy.

## 2023-06-04 ENCOUNTER — Encounter (INDEPENDENT_AMBULATORY_CARE_PROVIDER_SITE_OTHER): Payer: BC Managed Care – PPO | Admitting: Ophthalmology

## 2023-06-04 DIAGNOSIS — H35033 Hypertensive retinopathy, bilateral: Secondary | ICD-10-CM

## 2023-06-04 DIAGNOSIS — I10 Essential (primary) hypertension: Secondary | ICD-10-CM | POA: Diagnosis not present

## 2023-06-04 DIAGNOSIS — H43813 Vitreous degeneration, bilateral: Secondary | ICD-10-CM

## 2023-06-04 DIAGNOSIS — Z7985 Long-term (current) use of injectable non-insulin antidiabetic drugs: Secondary | ICD-10-CM

## 2023-06-04 DIAGNOSIS — E113313 Type 2 diabetes mellitus with moderate nonproliferative diabetic retinopathy with macular edema, bilateral: Secondary | ICD-10-CM | POA: Diagnosis not present

## 2023-06-10 ENCOUNTER — Other Ambulatory Visit: Payer: Self-pay | Admitting: Student

## 2023-06-10 DIAGNOSIS — E119 Type 2 diabetes mellitus without complications: Secondary | ICD-10-CM

## 2023-06-10 DIAGNOSIS — I10 Essential (primary) hypertension: Secondary | ICD-10-CM

## 2023-06-13 ENCOUNTER — Other Ambulatory Visit: Payer: Self-pay | Admitting: Student

## 2023-06-13 DIAGNOSIS — E7800 Pure hypercholesterolemia, unspecified: Secondary | ICD-10-CM

## 2023-06-15 ENCOUNTER — Ambulatory Visit (INDEPENDENT_AMBULATORY_CARE_PROVIDER_SITE_OTHER): Payer: BC Managed Care – PPO | Admitting: Student

## 2023-06-15 ENCOUNTER — Other Ambulatory Visit: Payer: Self-pay

## 2023-06-15 VITALS — BP 145/80 | HR 72 | Ht 66.0 in | Wt 202.2 lb

## 2023-06-15 DIAGNOSIS — E119 Type 2 diabetes mellitus without complications: Secondary | ICD-10-CM | POA: Diagnosis not present

## 2023-06-15 DIAGNOSIS — G479 Sleep disorder, unspecified: Secondary | ICD-10-CM

## 2023-06-15 DIAGNOSIS — I10 Essential (primary) hypertension: Secondary | ICD-10-CM

## 2023-06-15 LAB — POCT GLYCOSYLATED HEMOGLOBIN (HGB A1C): HbA1c, POC (controlled diabetic range): 8.2 % — AB (ref 0.0–7.0)

## 2023-06-15 NOTE — Progress Notes (Signed)
  SUBJECTIVE:   CHIEF COMPLAINT / HPI:   Difficulty Sleeping  -Has trouble sleeping, falling asleep but not staying asleep   -Works third shift, trying to get a schedule that is better and will start 1st shift schedule in August timeframe   -has tried Melatonin with no relief  -Does leave TV on sometimes before bed -No fever or chills, chest pain or dyspnea -Use Benadryl to assist with sleep at night  Type 2 Diabetes: Home medications include: Trulicity and Basaglar 30U. Does endorse compliance.   Most recent A1Cs:  Lab Results  Component Value Date   HGBA1C 8.2 (A) 06/15/2023   HGBA1C 7.9 (A) 02/07/2023   Last Microalbumin, LDL, Creatinine: Lab Results  Component Value Date   LDLCALC 131 (H) 06/28/2022   CREATININE 1.19 (H) 06/15/2023    Patient is up to date on diabetic eye. Patient is up to date on diabetic foot exam.   PERTINENT  PMH / PSH:   Past Medical History:  Diagnosis Date   Diabetes mellitus without complication (HCC)    Patient Care Team: Alfredo Martinez, MD as PCP - General (Family Medicine) OBJECTIVE:  BP (!) 145/80   Pulse 72   Ht 5\' 6"  (1.676 m)   Wt 202 lb 3.2 oz (91.7 kg)   SpO2 100%   BMI 32.64 kg/m  Physical Exam  General: Alert and oriented in no apparent distress Heart: Regular rate and rhythm with no murmurs appreciated Lungs: CTA bilaterally, no wheezing Abdomen: Bowel sounds present, no abdominal pain Skin: Warm and dry Extremities: No lower extremity edema  ASSESSMENT/PLAN:  Diabetes mellitus without complication (HCC) Assessment & Plan: stable - Last A1c:  Lab Results  Component Value Date   HGBA1C 8.2 (A) 06/15/2023   - Medications: Increased Trulicity to 1.5 mg and continue Basaglar dosing.  - Compliance: Appropriate - UTD on eye exam, records in media tab  - UTD on foot exam - On lipitor    Orders: -     POCT glycosylated hemoglobin (Hb A1C) -     Basic metabolic panel  Sleeping difficulties Assessment &  Plan: Sleep hygiene in AVS, try this first. Discourage frequent benadryl use. Transition to first shift will likely help. Return 4 weeks.    Hypertension, unspecified type Assessment & Plan: BP: (!) 145/80 today.  Could improve . Continue to work on healthy dietary habits and exercise. Follow up in 4 weeks  Medication regimen: Increased Amlodipine to 10 mg and continue Losartan, UTD BMP ordered.      Return in about 4 weeks (around 07/13/2023). Alfredo Martinez, MD 06/16/2023, 9:11 PM PGY-3, [facility]

## 2023-06-15 NOTE — Patient Instructions (Addendum)
It was great to see you today! Thank you for choosing [facility] for your primary care. Allison Lopez was seen for follow up.  Today we addressed: Increase Trulicity to 1.5 mg  I want to see you in 4 weeks    What can I do to improve my insomnia? -- You can follow good "sleep hygiene." That means that you: ?Sleep only long enough to feel rested and then get out of bed ?Go to bed and get up at the same time every day ?Do not try to force yourself to sleep. If you can't sleep, get out of bed and try again later. ?Have coffee, tea, and other foods that have caffeine only in the morning ?Avoid alcohol in the late afternoon, evening, and bedtime ?Avoid smoking, especially in the evening ?Keep your bedroom dark, cool, quiet, and free of reminders of work or other things that cause you stress ?Solve problems you have before you go to bed ?Exercise several days a week, but not right before bed ?Avoid looking at phones or reading devices ("e-books") that give off light before bed. This can make it harder to fall asleep. Other things that can improve sleep include: ?Relaxation therapy, in which you focus on relaxing all the muscles in your body 1 by 1 ?Working with a Conservator, museum/gallery to deal with the problems that might be causing poor sleep  Sleep hygiene guidelines Recommendation Details  Regular bedtime and rise time Having a consistent bedtime and rise time leads to more regular sleep schedules and avoids periods of sleep deprivation or periods of extended wakefulness during the night.  Avoid napping Avoid napping, especially naps lasting longer than 1 hour and naps late in the day.  Limit caffeine Avoid caffeine after lunch. The time between lunch and bedtime represents approximately 2 half-lives for caffeine, and this time window allows for most caffeine to be metabolized before bedtime.  Limit alcohol Recommendations are typically focused on avoiding alcohol near bedtime.  Alcohol is initially sedating, but activating as it is metabolized. Alcohol also negatively impacts sleep architecture.  Avoid nicotine Nicotine is a stimulant and should be avoided near bedtime and at night.  Exercise Daytime physical activity is encouraged, in particular, 4 to 6 hours before bedtime, as this may facilitate sleep onset. Rigorous exercise within 2 hours of bedtime is discouraged.  Keep the sleep environment quiet and dark Noise and light exposure during the night can disrupt sleep. White noise or ear plugs are often recommended to reduce noise. Using blackout shades or an eye mask is commonly recommended to reduce light. This may also include avoiding exposure to television or technology near bedtime, as this can have an impact on circadian rhythms by shifting sleep timing later.   Bedroom clock Avoid checking the time at night. This includes alarm clocks and other time pieces (eg, watches and smart phones). Checking the time increases cognitive arousal and prolongs wakefulness.  Evening eating Avoid a large meal near bedtime, but don't go to bed hungry. Eat a healthy and filling meal in the evening and avoid late-night snacks.    If you haven't already, sign up for My Chart to have easy access to your labs results, and communication with your primary care physician.  I recommend that you always bring your medications to each appointment as this makes it easy to ensure you are on the correct medications and helps [place] not miss refills when you need them. Call the clinic at [phone] if  your symptoms worsen or you have any concerns.  You should return to our clinic Return in about 4 weeks (around 07/13/2023). Please arrive 15 minutes before your appointment to ensure smooth check in process.  We appreciate your efforts in making this happen.  Thank you for allowing me to participate in your care, Alfredo Martinez, MD 06/15/2023, 4:06 PM PGY-2, [facility]

## 2023-06-16 ENCOUNTER — Encounter: Payer: Self-pay | Admitting: Student

## 2023-06-16 DIAGNOSIS — G479 Sleep disorder, unspecified: Secondary | ICD-10-CM | POA: Insufficient documentation

## 2023-06-16 LAB — BASIC METABOLIC PANEL WITH GFR
BUN/Creatinine Ratio: 16 (ref 9–23)
BUN: 19 mg/dL (ref 6–24)
CO2: 23 mmol/L (ref 20–29)
Calcium: 9.3 mg/dL (ref 8.7–10.2)
Chloride: 104 mmol/L (ref 96–106)
Creatinine, Ser: 1.19 mg/dL — ABNORMAL HIGH (ref 0.57–1.00)
Glucose: 120 mg/dL — ABNORMAL HIGH (ref 70–99)
Potassium: 4 mmol/L (ref 3.5–5.2)
Sodium: 141 mmol/L (ref 134–144)
eGFR: 53 mL/min/1.73 — ABNORMAL LOW

## 2023-06-16 NOTE — Assessment & Plan Note (Signed)
Sleep hygiene in AVS, try this first. Discourage frequent benadryl use. Transition to first shift will likely help. Return 4 weeks.

## 2023-06-16 NOTE — Assessment & Plan Note (Signed)
BP: (!) 145/80 today.  Could improve . Continue to work on healthy dietary habits and exercise. Follow up in 4 weeks  Medication regimen: Increased Amlodipine to 10 mg and continue Losartan, UTD BMP ordered.

## 2023-06-16 NOTE — Assessment & Plan Note (Addendum)
stable - Last A1c:  Lab Results  Component Value Date   HGBA1C 8.2 (A) 06/15/2023   - Medications: Increased Trulicity to 1.5 mg and continue Basaglar dosing.  - Compliance: Appropriate - UTD on eye exam, records in media tab  - UTD on foot exam - On lipitor

## 2023-06-20 ENCOUNTER — Other Ambulatory Visit: Payer: Self-pay | Admitting: Student

## 2023-06-20 DIAGNOSIS — I10 Essential (primary) hypertension: Secondary | ICD-10-CM

## 2023-06-20 DIAGNOSIS — E119 Type 2 diabetes mellitus without complications: Secondary | ICD-10-CM

## 2023-06-28 ENCOUNTER — Other Ambulatory Visit: Payer: Self-pay | Admitting: Student

## 2023-07-03 ENCOUNTER — Other Ambulatory Visit (HOSPITAL_COMMUNITY): Payer: Self-pay

## 2023-07-04 ENCOUNTER — Telehealth: Payer: Self-pay

## 2023-07-04 NOTE — Telephone Encounter (Signed)
Patient calls nurse line in regards to Basaglar.   She reports she went to pick up her refill and reported a cash price of ~300 dollars.   I called the pharmacy and confirmed above.   I reached out to [place] who reported her test claim showed a 50 day supply (box of 5 pens) to be 35 dollars.  I called [facility] again who continue to report there system shows a cash price of ~300 dollars for a box of 5 pens. I confirmed patients insurance and ID number.   Patient advised to call her insurance company.   Patent will me back with an update.

## 2023-07-09 ENCOUNTER — Encounter (INDEPENDENT_AMBULATORY_CARE_PROVIDER_SITE_OTHER): Payer: BC Managed Care – PPO | Admitting: Ophthalmology

## 2023-07-09 DIAGNOSIS — I10 Essential (primary) hypertension: Secondary | ICD-10-CM

## 2023-07-09 DIAGNOSIS — H35033 Hypertensive retinopathy, bilateral: Secondary | ICD-10-CM | POA: Diagnosis not present

## 2023-07-09 DIAGNOSIS — H43813 Vitreous degeneration, bilateral: Secondary | ICD-10-CM | POA: Diagnosis not present

## 2023-07-09 DIAGNOSIS — E113313 Type 2 diabetes mellitus with moderate nonproliferative diabetic retinopathy with macular edema, bilateral: Secondary | ICD-10-CM | POA: Diagnosis not present

## 2023-07-09 DIAGNOSIS — Z7985 Long-term (current) use of injectable non-insulin antidiabetic drugs: Secondary | ICD-10-CM

## 2023-07-17 ENCOUNTER — Other Ambulatory Visit: Payer: Self-pay | Admitting: Student

## 2023-07-17 DIAGNOSIS — K5900 Constipation, unspecified: Secondary | ICD-10-CM

## 2023-07-20 ENCOUNTER — Ambulatory Visit (INDEPENDENT_AMBULATORY_CARE_PROVIDER_SITE_OTHER): Payer: BC Managed Care – PPO | Admitting: Student

## 2023-07-20 ENCOUNTER — Encounter: Payer: Self-pay | Admitting: Student

## 2023-07-20 VITALS — BP 157/88 | HR 79 | Ht 66.0 in | Wt 206.4 lb

## 2023-07-20 DIAGNOSIS — I10 Essential (primary) hypertension: Secondary | ICD-10-CM

## 2023-07-20 DIAGNOSIS — F4321 Adjustment disorder with depressed mood: Secondary | ICD-10-CM | POA: Diagnosis not present

## 2023-07-20 DIAGNOSIS — F4320 Adjustment disorder, unspecified: Secondary | ICD-10-CM | POA: Insufficient documentation

## 2023-07-20 MED ORDER — AMLODIPINE BESYLATE 10 MG PO TABS
10.0000 mg | ORAL_TABLET | Freq: Every day | ORAL | 2 refills | Status: DC
Start: 2023-07-20 — End: 2023-10-18

## 2023-07-20 NOTE — Assessment & Plan Note (Signed)
FMLA is reasonable Continue with Paroxetine 20 mg  Counseling  No SI or HI, stable for outpatient therapy  Return in 3 weeks to discuss further

## 2023-07-20 NOTE — Progress Notes (Signed)
  SUBJECTIVE:   CHIEF COMPLAINT / HPI:   Hypertension: BP: (!) 157/88 today. Home medications include: Amlodipine 10 mg was instructed on last visit but seems patient is still on Amlodipine 5mg , Losartan 100 mg tab. Does not check blood pressure at home.  Most recent creatinine trend:  Lab Results  Component Value Date   CREATININE 1.19 (H) 06/15/2023   CREATININE 1.09 (H) 06/28/2022   Patient has had a BMP in the past 1 year.  Depressed Mood: Lost mom previously last October Struggling with her work and grief reaction to the passing of her mom Cries intermittently Stays in bed often  Has a grief counselor who recommended FMLA as she calls out of work often  No SI/HI On Paroxetine, sometimes 10 mg and sometimes 20 mg  Was started on this by her OBGYN  Has a good support system in aunt and son   PERTINENT  PMH / PSH:   GERD DM2--On Trulicity and Basaglar--Was unable to obtain Trulicity 2/2 cost, uses Hospital doctor 35 U daily with fasting 120s  Multinodular goiter, benign, see U/S  Depressed mood, lost mom recently--sees therapist  Colon Polyps--UTD on colonoscopy    Patient Care Team: Alfredo Martinez, MD as PCP - General (Family Medicine) OBJECTIVE:  BP (!) 157/88   Pulse 79   Ht 5\' 6"  (1.676 m)   Wt 206 lb 6 oz (93.6 kg)   SpO2 98%   BMI 33.31 kg/m  Physical Exam  General: Alert and oriented in no apparent distress; appropriately responsive  Heart: Regular rate and rhythm with no murmurs appreciated Lungs: Normal WOB Skin: Warm and dry Psych: Appropriate mood and affect   ASSESSMENT/PLAN:  Grief reaction Assessment & Plan: FMLA is reasonable Continue with Paroxetine 20 mg  Counseling  No SI or HI, stable for outpatient therapy  Return in 3 weeks to discuss further    Hypertension, unspecified type Assessment & Plan: Increase Amlodipine to 10 mg and keep losartan at 100 mg --uncontrolled BP UTD on BMP   Discuss home BP monitoring devices available to  patient on next visit.   Orders: -     amLODIPine Besylate; Take 1 tablet (10 mg total) by mouth at bedtime.  Dispense: 30 tablet; Refill: 2   Return in about 3 weeks (around 08/10/2023) for Grief/HTN. Alfredo Martinez, MD 07/20/2023, 11:01 AM PGY-3, [facility]

## 2023-07-20 NOTE — Patient Instructions (Addendum)
It was great to see you today! Thank you for choosing [facility] for your primary care.  Today we addressed: Continue with the Paroxetine (you can take 20 mg) and counseling  I will talk to our pharmacy technician about the medication coverage  Keep using Basaglar 35 U daily Keep a log of your sugars  Increase Amlodipine to 10 mg   If you haven't already, sign up for My Chart to have easy access to your labs results, and communication with your primary care physician. I recommend that you always bring your medications to each appointment as this makes it easy to ensure you are on the correct medications and helps [place] not miss refills when you need them. Call the clinic at [phone] if your symptoms worsen or you have any concerns. Return in about 3 weeks (around 08/10/2023) for Grief/HTN. Please arrive 15 minutes before your appointment to ensure smooth check in process.  We appreciate your efforts in making this happen.  Thank you for allowing me to participate in your care, Alfredo Martinez, MD 07/20/2023, 9:08 AM PGY-3, [facility]

## 2023-07-20 NOTE — Assessment & Plan Note (Signed)
Increase Amlodipine to 10 mg and keep losartan at 100 mg --uncontrolled BP UTD on BMP   Discuss home BP monitoring devices available to patient on next visit.

## 2023-07-30 DIAGNOSIS — K635 Polyp of colon: Secondary | ICD-10-CM | POA: Diagnosis not present

## 2023-07-30 DIAGNOSIS — I10 Essential (primary) hypertension: Secondary | ICD-10-CM | POA: Diagnosis not present

## 2023-07-30 DIAGNOSIS — D122 Benign neoplasm of ascending colon: Secondary | ICD-10-CM | POA: Diagnosis not present

## 2023-07-30 DIAGNOSIS — D125 Benign neoplasm of sigmoid colon: Secondary | ICD-10-CM | POA: Diagnosis not present

## 2023-07-30 DIAGNOSIS — Z1211 Encounter for screening for malignant neoplasm of colon: Secondary | ICD-10-CM | POA: Diagnosis not present

## 2023-08-08 ENCOUNTER — Encounter (INDEPENDENT_AMBULATORY_CARE_PROVIDER_SITE_OTHER): Payer: BC Managed Care – PPO | Admitting: Ophthalmology

## 2023-08-08 DIAGNOSIS — H43813 Vitreous degeneration, bilateral: Secondary | ICD-10-CM

## 2023-08-08 DIAGNOSIS — H35033 Hypertensive retinopathy, bilateral: Secondary | ICD-10-CM

## 2023-08-08 DIAGNOSIS — Z7985 Long-term (current) use of injectable non-insulin antidiabetic drugs: Secondary | ICD-10-CM

## 2023-08-08 DIAGNOSIS — I10 Essential (primary) hypertension: Secondary | ICD-10-CM | POA: Diagnosis not present

## 2023-08-08 DIAGNOSIS — E113313 Type 2 diabetes mellitus with moderate nonproliferative diabetic retinopathy with macular edema, bilateral: Secondary | ICD-10-CM

## 2023-08-14 ENCOUNTER — Encounter: Payer: Self-pay | Admitting: Student

## 2023-08-14 ENCOUNTER — Ambulatory Visit (INDEPENDENT_AMBULATORY_CARE_PROVIDER_SITE_OTHER): Payer: BC Managed Care – PPO | Admitting: Student

## 2023-08-14 VITALS — BP 129/75 | HR 75 | Ht 66.0 in | Wt 204.1 lb

## 2023-08-14 DIAGNOSIS — F4321 Adjustment disorder with depressed mood: Secondary | ICD-10-CM

## 2023-08-14 NOTE — Patient Instructions (Addendum)
It was great to see you today! Thank you for choosing [facility] for your primary care.  Today we addressed:  Call if you have not heard from me in two days Follow up in 1 month to assess mood   If you haven't already, sign up for My Chart to have easy access to your labs results, and communication with your primary care physician. I recommend that you always bring your medications to each appointment as this makes it easy to ensure you are on the correct medications and helps [place] not miss refills when you need them. Call the clinic at [phone] if your symptoms worsen or you have any concerns. Return in about 4 weeks (around 09/11/2023) for Mood, Grief. Please arrive 15 minutes before your appointment to ensure smooth check in process.  We appreciate your efforts in making this happen.  Thank you for allowing me to participate in your care, Alfredo Martinez, MD 08/14/2023, 3:35 PM PGY-3, [facility]

## 2023-08-14 NOTE — Progress Notes (Unsigned)
    SUBJECTIVE:   CHIEF COMPLAINT / HPI:   Grief: Lost mom previously in October 2023.  Had previously been struggling with her work and juggling that with her grief reaction to the passing of her mother.  She stays in bed quite often.  Her grief counselor recommended FMLA for which we continued with.  She is on paroxetine and goes to therapy.  Has a good support system.  PERTINENT  PMH / PSH:  GERD DM2--Was unable to obtain Trulicity 2/2 cost, uses Basaglar 35 U daily with fasting 120s  Multinodular goiter, benign, see U/S  Depressed mood, lost mom recently--sees therapist + Paroxetine Colon Polyps--UTD on colonoscopy  Healthcare Maintenance needs--Eye exam, HIV, Hep C, Foot exam, shingrix  OBJECTIVE:   There were no vitals taken for this visit.  General: Alert and oriented in no apparent distress Heart: Regular rate and rhythm with no murmurs appreciated Lungs: CTA bilaterally, no wheezing Abdomen: Bowel sounds present, no abdominal pain Skin: Warm and dry Extremities: No lower extremity edema   ASSESSMENT/PLAN:   No problem-specific Assessment & Plan notes found for this encounter.     Alfredo Martinez, MD [facility]

## 2023-08-15 NOTE — Assessment & Plan Note (Signed)
Brought in FMLA to complete. Continue with therapy and Paroxetine. Return in 1 month for reassessment. Need to discuss Care Gaps at next visit as well.

## 2023-08-29 ENCOUNTER — Telehealth: Payer: Self-pay | Admitting: Student

## 2023-08-29 NOTE — Telephone Encounter (Signed)
Clinical info completed on FMLA form.  Placed form in PCP's box for completion.    When form is completed, please route note to "RN Team" and place in wall pocket in front office.   Aquilla Solian, CMA

## 2023-08-29 NOTE — Telephone Encounter (Signed)
patient dropped off form at front desk for [facility].  Verified that patient section of form has been completed.  Last DOS/WCC with PCP was 08/14/23.  Placed form in blue team folder to be completed by clinical staff.  Vilinda Blanks

## 2023-09-04 ENCOUNTER — Encounter (INDEPENDENT_AMBULATORY_CARE_PROVIDER_SITE_OTHER): Payer: BC Managed Care – PPO | Admitting: Ophthalmology

## 2023-09-04 DIAGNOSIS — H43813 Vitreous degeneration, bilateral: Secondary | ICD-10-CM

## 2023-09-04 DIAGNOSIS — E113313 Type 2 diabetes mellitus with moderate nonproliferative diabetic retinopathy with macular edema, bilateral: Secondary | ICD-10-CM | POA: Diagnosis not present

## 2023-09-04 DIAGNOSIS — I10 Essential (primary) hypertension: Secondary | ICD-10-CM | POA: Diagnosis not present

## 2023-09-04 DIAGNOSIS — Z7985 Long-term (current) use of injectable non-insulin antidiabetic drugs: Secondary | ICD-10-CM

## 2023-09-04 DIAGNOSIS — H35033 Hypertensive retinopathy, bilateral: Secondary | ICD-10-CM

## 2023-09-06 NOTE — Telephone Encounter (Signed)
Form faxed to provided number. ROI on file.   Veronda Prude, RN

## 2023-09-25 ENCOUNTER — Other Ambulatory Visit: Payer: Self-pay | Admitting: Family Medicine

## 2023-10-02 ENCOUNTER — Encounter (INDEPENDENT_AMBULATORY_CARE_PROVIDER_SITE_OTHER): Payer: BC Managed Care – PPO | Admitting: Ophthalmology

## 2023-10-02 DIAGNOSIS — I10 Essential (primary) hypertension: Secondary | ICD-10-CM

## 2023-10-02 DIAGNOSIS — E113313 Type 2 diabetes mellitus with moderate nonproliferative diabetic retinopathy with macular edema, bilateral: Secondary | ICD-10-CM

## 2023-10-02 DIAGNOSIS — H43813 Vitreous degeneration, bilateral: Secondary | ICD-10-CM

## 2023-10-02 DIAGNOSIS — H35033 Hypertensive retinopathy, bilateral: Secondary | ICD-10-CM

## 2023-10-02 DIAGNOSIS — Z7985 Long-term (current) use of injectable non-insulin antidiabetic drugs: Secondary | ICD-10-CM

## 2023-10-11 ENCOUNTER — Other Ambulatory Visit: Payer: Self-pay | Admitting: Family Medicine

## 2023-10-18 ENCOUNTER — Other Ambulatory Visit: Payer: Self-pay | Admitting: Student

## 2023-10-18 DIAGNOSIS — I10 Essential (primary) hypertension: Secondary | ICD-10-CM

## 2023-10-30 ENCOUNTER — Encounter (INDEPENDENT_AMBULATORY_CARE_PROVIDER_SITE_OTHER): Payer: BC Managed Care – PPO | Admitting: Ophthalmology

## 2023-10-30 DIAGNOSIS — Z7985 Long-term (current) use of injectable non-insulin antidiabetic drugs: Secondary | ICD-10-CM | POA: Diagnosis not present

## 2023-10-30 DIAGNOSIS — I10 Essential (primary) hypertension: Secondary | ICD-10-CM | POA: Diagnosis not present

## 2023-10-30 DIAGNOSIS — H35033 Hypertensive retinopathy, bilateral: Secondary | ICD-10-CM

## 2023-10-30 DIAGNOSIS — E113392 Type 2 diabetes mellitus with moderate nonproliferative diabetic retinopathy without macular edema, left eye: Secondary | ICD-10-CM | POA: Diagnosis not present

## 2023-10-30 DIAGNOSIS — E113311 Type 2 diabetes mellitus with moderate nonproliferative diabetic retinopathy with macular edema, right eye: Secondary | ICD-10-CM

## 2023-10-30 DIAGNOSIS — H43813 Vitreous degeneration, bilateral: Secondary | ICD-10-CM

## 2023-11-09 ENCOUNTER — Encounter (HOSPITAL_COMMUNITY): Payer: Self-pay | Admitting: *Deleted

## 2023-11-09 ENCOUNTER — Ambulatory Visit (HOSPITAL_COMMUNITY)
Admission: EM | Admit: 2023-11-09 | Discharge: 2023-11-09 | Disposition: A | Discharge status: Home / Self Care | Payer: BC Managed Care – PPO | Attending: Emergency Medicine | Admitting: Emergency Medicine

## 2023-11-09 DIAGNOSIS — R040 Epistaxis: Secondary | ICD-10-CM | POA: Diagnosis not present

## 2023-11-09 LAB — CBC
HCT: 35 % — ABNORMAL LOW (ref 36.0–46.0)
Hemoglobin: 11.8 g/dL — ABNORMAL LOW (ref 12.0–15.0)
MCH: 26.3 pg (ref 26.0–34.0)
MCHC: 33.7 g/dL (ref 30.0–36.0)
MCV: 78 fL — ABNORMAL LOW (ref 80.0–100.0)
Platelets: 254 10*3/uL (ref 150–400)
RBC: 4.49 MIL/uL (ref 3.87–5.11)
RDW: 14.4 % (ref 11.5–15.5)
WBC: 7.8 10*3/uL (ref 4.0–10.5)
nRBC: 0 % (ref 0.0–0.2)

## 2023-11-09 LAB — BASIC METABOLIC PANEL
Anion gap: 13 (ref 5–15)
BUN: 19 mg/dL (ref 6–20)
CO2: 23 mmol/L (ref 22–32)
Calcium: 9.1 mg/dL (ref 8.9–10.3)
Chloride: 99 mmol/L (ref 98–111)
Creatinine, Ser: 1.33 mg/dL — ABNORMAL HIGH (ref 0.44–1.00)
GFR, Estimated: 46 mL/min — ABNORMAL LOW (ref >60)
Glucose, Bld: 480 mg/dL — ABNORMAL HIGH (ref 70–99)
Potassium: 4 mmol/L (ref 3.5–5.1)
Sodium: 135 mmol/L (ref 135–145)

## 2023-11-09 NOTE — ED Triage Notes (Signed)
Pt states she has been having nose bleeds X 1 week. She states it stops but when she blows her nose it restarts. She states that work wanted to call EMS but she declined.

## 2023-11-09 NOTE — ED Provider Notes (Addendum)
[facility]    CSN: [id number] Arrival date & time: 11/09/23  0850      History   Chief Complaint Chief Complaint  Patient presents with   Epistaxis    HPI Allison Lopez is a 62 y.o. female.   Patient presents to clinic for intermittent epistaxis over the past week.  She is unsure what causes it.  Reports when she is sleeping she will feel like her nose is running with rhinorrhea or congestion, but wakes up and has been blood.  She had a bad nosebleed last night at work and again this morning.  She did try to go to her PCP, but they did not have any appointments available.  She has not been congested, has not been blowing her nose anymore than normal.  She has not used any nasal spray.  No recent illnesses.  Reports her blood pressure is normally well-controlled and she takes her blood pressure medications in the morning after she gets off from work, where she works third shift.  She will use ice or gauze and lean backwards when her nose starts to bleed.    The history is provided by the patient and medical records.  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      Home Medications    Prior to Admission medications   Medication Sig Start Date End Date Taking? Authorizing Provider  amLODipine (NORVASC) 10 MG tablet TAKE 1 TABLET BY MOUTH EVERYDAY AT BEDTIME 10/18/23  Yes Maxwell, Allee, MD   atorvastatin (LIPITOR) 40 MG tablet TAKE 1 TABLET BY MOUTH EVERY DAY 06/14/23  Yes Maxwell, Allee, MD  Insulin Glargine (BASAGLAR KWIKPEN) 100 UNIT/ML INJECT 30 UNITS INTO THE SKIN DAILY 06/21/23  Yes Alfredo Martinez, MD  losartan (COZAAR) 100 MG tablet TAKE 1 TABLET BY MOUTH EVERYDAY AT BEDTIME 06/11/23  Yes Maxwell, Allee, MD  PARoxetine (PAXIL) 10 MG tablet Take 10 mg by mouth daily.   Yes [provider]  Blood Glucose Monitoring Suppl (ONETOUCH VERIO) w/Device KIT Check blood sugar once daily 05/21/23   Alfredo Martinez, MD  glucose blood (ONETOUCH VERIO) test strip Check blood sugar once daily 05/21/23   Alfredo Martinez, MD  glucose blood test strip Use as instructed 07/14/22   Alfredo Martinez, MD  Insulin Pen Needle (B-D ULTRAFINE III SHORT PEN) 31G X 8 MM MISC 1 EACH BY DOES NOT APPLY ROUTE 3 (THREE) TIMES DAILY WITH MEALS 03/22/23   Levin Erp, MD  Lancet Devices [facility] DELICA PLUS LANCING) MISC USE TO CHECK BLOOD SUGAR ONCE DAILY 06/29/23   Alfredo Martinez, MD  OneTouch Delica Lancets 33G MISC Check blood sugar once daily 05/21/23   Alfredo Martinez, MD  polyethylene glycol powder (GLYCOLAX/MIRALAX) 17 GM/SCOOP powder Take 17 g by mouth 2 (two) times daily as needed. 06/28/22   Alfredo Martinez, MD    Family History Family  History  Problem Relation Age of Onset   Cancer Maternal Aunt        breast   Cancer Paternal Uncle        throat    Social History Social History   Tobacco Use   Smoking status: Never  Vaping Use   Vaping status: Never Used  Substance Use Topics   Alcohol use: Yes    Comment: 1xweek   Drug use: No     Allergies   Patient has no known allergies.   Review of Systems Review of Systems  Per HPI   Physical Exam Triage Vital Signs ED Triage Vitals  Encounter Vitals Group     BP 11/09/23 0908 (!) 162/82     Systolic BP Percentile --      Diastolic BP Percentile --      Pulse Rate 11/09/23 0908 72     Resp 11/09/23 0908 18     Temp 11/09/23  0908 98.3 F (36.8 C)     Temp Source 11/09/23 0908 Oral     SpO2 11/09/23 0908 95 %     Weight --      Height --      Head Circumference --      Peak Flow --      Pain Score 11/09/23 0906 0     Pain Loc --      Pain Education --      Exclude from Growth Chart --    No data found.  Updated Vital Signs BP (!) 162/82 (BP Location: Right Arm)   Pulse 72   Temp 98.3 F (36.8 C) (Oral)   Resp 18   SpO2 95%   Visual Acuity Right Eye Distance:   Left Eye Distance:   Bilateral Distance:    Right Eye Near:   Left Eye Near:    Bilateral Near:     Physical Exam Vitals and nursing note reviewed.  Constitutional:      Appearance: Normal appearance.  HENT:     Head: Normocephalic and atraumatic.     Right Ear: External ear normal.     Left Ear: External ear normal.     Nose: Congestion present.     Comments: Blood present in both nares, no active bleeding.     Mouth/Throat:     Mouth: Mucous membranes are moist.  Eyes:     Conjunctiva/sclera: Conjunctivae normal.  Cardiovascular:     Rate and Rhythm: Normal rate.  Pulmonary:     Effort: Pulmonary effort is normal. No respiratory distress.  Musculoskeletal:        General: Normal range of motion.  Skin:    General: Skin is warm and dry.  Neurological:     General: No focal deficit present.     Mental Status: She is alert.  Psychiatric:        Mood and Affect: Mood normal.        Behavior: Behavior is cooperative.      UC Treatments / Results  Labs (all labs ordered are listed, but only abnormal results are displayed) Labs Reviewed  BASIC METABOLIC PANEL  CBC    EKG   Radiology No results found.  Procedures Procedures (including critical care time)  Medications Ordered in UC Medications - No data to display  Initial Impression / Assessment and Plan / UC Course  I have reviewed the triage vital signs and the nursing notes.  Pertinent labs & imaging results that were available during my care  of the  patient were reviewed by me and considered in my medical decision making (see chart for details).  Vitals and triage reviewed, patient is hemodynamically stable.  She is hypertensive, has not yet taken her blood pressure medication today since she normally takes in the morning when she gets home after work and she has not yet been home.  Well-controlled on her antihypertensives.  Doubt that this is the cause of her epistaxis.  No current nosebleed, did have 1 prior to arrival.  Educated to apply pressure to both nares as this has not been done with previous nosebleeds.  No current bleeding or active epistaxis on physical exam.  Will check basic labs due to prolonged bleeding.  Advised moisturizing both nares.  Proper epistaxis treatment discussed.  ENT follow-up encouraged.  Return and emergency precautions given.  Work note provided.  Blood work shows slightly low hemoglobin, no need for emergent transfusion or evaluation.  Glucose is elevated.  Platelets are normal.  No changes in plan of care at this time.     Final Clinical Impressions(s) / UC Diagnoses   Final diagnoses:  Epistaxis     Discharge Instructions      I suggest sleeping with a humidifier and applying Vaseline gently to both of your nostrils throughout the day and before you go to bed at night.  If your nosebleed reoccurs please pinch off both nostrils and apply direct pressure for 10 to 15 minutes, leaning forward not backwards.  If you are unable to stop the nosebleed despite direct pressure, please follow-up at the nearest emergency department for further evaluation.  If this continues I suggest following up with the ENT.  Return to clinic for any new or urgent symptoms.      ED Prescriptions   None    PDMP not reviewed this encounter.   Mionna Advincula, [place] N, [place] 11/09/23 0941    Malea Swilling, [place] N, FNP 11/09/23 [phone]

## 2023-11-09 NOTE — Discharge Instructions (Addendum)
I suggest sleeping with a humidifier and applying Vaseline gently to both of your nostrils throughout the day and before you go to bed at night.  If your nosebleed reoccurs please pinch off both nostrils and apply direct pressure for 10 to 15 minutes, leaning forward not backwards.  If you are unable to stop the nosebleed despite direct pressure, please follow-up at the nearest emergency department for further evaluation.  If this continues I suggest following up with the ENT.  Return to clinic for any new or urgent symptoms.

## 2023-11-19 ENCOUNTER — Encounter: Payer: BC Managed Care – PPO | Admitting: Student

## 2023-11-19 NOTE — Progress Notes (Unsigned)
    SUBJECTIVE:   Chief compliant/HPI: annual examination  Allison Lopez is a 62 y.o. who presents today for an annual exam.    History tabs reviewed and updated ***.   Review of systems form reviewed and notable for ***.   OBJECTIVE:   There were no vitals taken for this visit.  ***  ASSESSMENT/PLAN:   No problem-specific Assessment & Plan notes found for this encounter.    Annual Examination  See AVS for age appropriate recommendations  PHQ score     08/14/2023    3:16 PM 07/20/2023    8:29 AM 06/15/2023    3:28 PM  PHQ9 SCORE ONLY  PHQ-9 Total Score 12 16 6    reviewed and discussed.  BP reviewed and at goal ***.  Asked about intimate partner violence and resources given as appropriate  Advance directives discussion ***  Considered the following items based upon USPSTF recommendations: Diabetes screening: {discussed/ordered:14545} Screening for elevated cholesterol: {discussed/ordered:14545} HIV testing: {discussed/ordered:14545} Hepatitis C: {discussed/ordered:14545} Hepatitis B: N/A Syphilis if at high risk: {discussed/ordered:14545} GC/CT {GC/CT screening :23818} Osteoporosis screening considered based upon risk of fracture from [facility] calculator. Major osteoporotic fracture risk is ***%. DEXA {ordered not order:23822}.  Reviewed risk factors for latent tuberculosis and {not indicated/requested/declined:14582}   Discussed family history, BRCA testing {not indicated/requested/declined:14582}. Tool used to risk stratify was ***.  Cervical cancer screening: {PAPTYPE:23819} Breast cancer screening: {mammoscreen:23820} Colorectal cancer screening: {crcscreen:23821::"discussed, colonoscopy ordered"} Lung cancer screening: {discussed/declined/written info:19698}. See documentation below regarding indications/risks/benefits.  Vaccinations ***.   Follow up in 1 *** year or sooner if indicated.    Alfredo Martinez, MD [facility]

## 2023-11-26 ENCOUNTER — Encounter (INDEPENDENT_AMBULATORY_CARE_PROVIDER_SITE_OTHER): Payer: BC Managed Care – PPO | Admitting: Ophthalmology

## 2023-11-26 DIAGNOSIS — H43813 Vitreous degeneration, bilateral: Secondary | ICD-10-CM

## 2023-11-26 DIAGNOSIS — E113392 Type 2 diabetes mellitus with moderate nonproliferative diabetic retinopathy without macular edema, left eye: Secondary | ICD-10-CM

## 2023-11-26 DIAGNOSIS — H35033 Hypertensive retinopathy, bilateral: Secondary | ICD-10-CM

## 2023-11-26 DIAGNOSIS — E113311 Type 2 diabetes mellitus with moderate nonproliferative diabetic retinopathy with macular edema, right eye: Secondary | ICD-10-CM

## 2023-11-26 DIAGNOSIS — Z7985 Long-term (current) use of injectable non-insulin antidiabetic drugs: Secondary | ICD-10-CM

## 2023-11-26 DIAGNOSIS — I10 Essential (primary) hypertension: Secondary | ICD-10-CM | POA: Diagnosis not present

## 2023-12-04 DIAGNOSIS — F419 Anxiety disorder, unspecified: Secondary | ICD-10-CM | POA: Diagnosis not present

## 2023-12-04 DIAGNOSIS — B3731 Acute candidiasis of vulva and vagina: Secondary | ICD-10-CM | POA: Diagnosis not present

## 2023-12-04 DIAGNOSIS — R350 Frequency of micturition: Secondary | ICD-10-CM | POA: Diagnosis not present

## 2023-12-04 DIAGNOSIS — Z1331 Encounter for screening for depression: Secondary | ICD-10-CM | POA: Diagnosis not present

## 2023-12-04 DIAGNOSIS — Z01411 Encounter for gynecological examination (general) (routine) with abnormal findings: Secondary | ICD-10-CM | POA: Diagnosis not present

## 2023-12-04 DIAGNOSIS — Z1231 Encounter for screening mammogram for malignant neoplasm of breast: Secondary | ICD-10-CM | POA: Diagnosis not present

## 2023-12-04 DIAGNOSIS — Z01419 Encounter for gynecological examination (general) (routine) without abnormal findings: Secondary | ICD-10-CM | POA: Diagnosis not present

## 2023-12-11 ENCOUNTER — Encounter: Payer: BC Managed Care – PPO | Admitting: Student

## 2023-12-14 ENCOUNTER — Encounter (INDEPENDENT_AMBULATORY_CARE_PROVIDER_SITE_OTHER): Payer: BC Managed Care – PPO | Admitting: Ophthalmology

## 2023-12-20 ENCOUNTER — Other Ambulatory Visit: Payer: Self-pay | Admitting: Student

## 2023-12-20 DIAGNOSIS — I10 Essential (primary) hypertension: Secondary | ICD-10-CM

## 2023-12-20 MED ORDER — AMLODIPINE BESYLATE 10 MG PO TABS: 10.0000 mg | ORAL_TABLET | Freq: Every day | ORAL | 3 refills | Status: DC

## 2023-12-24 ENCOUNTER — Encounter (INDEPENDENT_AMBULATORY_CARE_PROVIDER_SITE_OTHER): Payer: BC Managed Care – PPO | Admitting: Ophthalmology

## 2023-12-24 DIAGNOSIS — Z7985 Long-term (current) use of injectable non-insulin antidiabetic drugs: Secondary | ICD-10-CM

## 2023-12-24 DIAGNOSIS — H35033 Hypertensive retinopathy, bilateral: Secondary | ICD-10-CM

## 2023-12-24 DIAGNOSIS — E113313 Type 2 diabetes mellitus with moderate nonproliferative diabetic retinopathy with macular edema, bilateral: Secondary | ICD-10-CM | POA: Diagnosis not present

## 2023-12-24 DIAGNOSIS — I10 Essential (primary) hypertension: Secondary | ICD-10-CM

## 2023-12-24 DIAGNOSIS — H43813 Vitreous degeneration, bilateral: Secondary | ICD-10-CM

## 2024-01-08 ENCOUNTER — Encounter: Payer: Self-pay | Admitting: Student

## 2024-01-08 ENCOUNTER — Ambulatory Visit (INDEPENDENT_AMBULATORY_CARE_PROVIDER_SITE_OTHER): Payer: BC Managed Care – PPO | Admitting: Student

## 2024-01-08 ENCOUNTER — Telehealth: Payer: Self-pay

## 2024-01-08 VITALS — BP 120/70 | HR 67 | Wt 209.8 lb

## 2024-01-08 DIAGNOSIS — E119 Type 2 diabetes mellitus without complications: Secondary | ICD-10-CM | POA: Diagnosis not present

## 2024-01-08 DIAGNOSIS — Z23 Encounter for immunization: Secondary | ICD-10-CM

## 2024-01-08 DIAGNOSIS — Z0000 Encounter for general adult medical examination without abnormal findings: Secondary | ICD-10-CM

## 2024-01-08 LAB — POCT GLYCOSYLATED HEMOGLOBIN (HGB A1C): HbA1c, POC (controlled diabetic range): 13.9 % — AB (ref 0.0–7.0)

## 2024-01-08 MED ORDER — SHINGRIX 50 MCG/0.5ML IM SUSR: 0.5000 mL | Freq: Once | INTRAMUSCULAR | 0 refills | Status: AC

## 2024-01-08 MED ORDER — INSULIN ASPART 100 UNIT/ML IJ SOLN: 5.0000 [IU] | Freq: Once | INTRAMUSCULAR | 3 refills | Status: DC

## 2024-01-08 MED ORDER — BASAGLAR KWIKPEN 100 UNIT/ML ~~LOC~~ SOPN: 40.0000 [IU] | PEN_INJECTOR | SUBCUTANEOUS | 11 refills | Status: DC

## 2024-01-08 NOTE — Patient Instructions (Addendum)
 It was great to see you today! Thank you for choosing [facility] for your primary care.  Today we addressed: Increase Basaglar  to 40 U daily  Added Novolog  to take thirty minutes before largest meal of the day 5U I have scheduled a follow up with our pharmacist as well  You need a mammogram to prevent breast cancer.  Please schedule an appointment.  You can call [phone].     Please come back to see me in 3 months    If you haven't already, sign up for My Chart to have easy access to your labs results, and communication with your primary care physician.   Please arrive 15 minutes before your appointment to ensure smooth check in process.  We appreciate your efforts in making this happen.  Thank you for allowing me to participate in your care, Laurier Hugger, MD 01/08/2024, 7:29 AM PGY-3, [facility]

## 2024-01-08 NOTE — Telephone Encounter (Signed)
 Patient calls nurse line regarding insurance not covering her insulin .   Called pharmacy. They state that generic is not covered by insurance. Pharmacist states that they can provide patient with covered alternative as there is no DAW.   Called patient and provided with update.   Allison JAYSON English, RN

## 2024-01-08 NOTE — Assessment & Plan Note (Addendum)
 needs improvement - Last A1c:  Lab Results  Component Value Date   HGBA1C 13.9 (A) 01/08/2024   - Medications: INCREASE Basaglar  to 40 U daily and start Novolog  5 U 30 minutes before largest meal of the day - Made appt with Dr. Koval upcoming, CGM candidate? - Cannot void need Uacr next visit in 3 months  - cholesterol check on next visit  - foot exam today  - continue statin

## 2024-01-08 NOTE — Progress Notes (Addendum)
    SUBJECTIVE:   CHIEF COMPLAINT / HPI:   Type 2 Diabetes: Home medications include: Basaglar  35 U daily- has stopped her GLP1 because of difficulty with coverage . Does endorse compliance with LAI. Home glucose monitoring is performed regularly- 200s-300s nonfasting, fasting 120. Patient is up to date on diabetic eye. Receives injections with eye doctor.  Most recent A1Cs:  Lab Results  Component Value Date   HGBA1C 13.9 (A) 01/08/2024   HGBA1C 8.2 (A) 06/15/2023      PERTINENT  PMH / PSH:  GERD  DM2--Was unable to obtain Trulicity  2/2 cost, uses Basaglar  35 U daily  On statin, STOPPED GLP1 2/2 lack of insurance coverage  Depressed mood, lost mom recently--sees therapist + Paroxetine Colon Polyps--UTD on colonoscopy  Healthcare Maintenance needs-- HIV, Hep C, Foot exam, shingrix  2/2 HTN--Amlodipine  10 mg, losartan  100 mg daily   OBJECTIVE:   BP 120/70   Pulse 67   Wt 209 lb 12.8 oz (95.2 kg)   SpO2 99%   BMI 33.86 kg/m   General: Alert and oriented in no apparent distress Heart: Regular rate and rhythm with no murmurs appreciated Lungs: CTA bilaterally, no wheezing Abdomen: no abdominal pain Skin: Warm and dry Extremities: No lower extremity edema Diabetic Foot Exam - Simple   Simple Foot Form Visual Inspection No deformities, no ulcerations, no other skin breakdown bilaterally: Yes Sensation Testing Intact to touch and monofilament testing bilaterally: Yes Pulse Check Posterior Tibialis and Dorsalis pulse intact bilaterally: Yes Comments      ASSESSMENT/PLAN:    Assessment & Plan Diabetes mellitus without complication (HCC) needs improvement - Last A1c:  Lab Results  Component Value Date   HGBA1C 13.9 (A) 01/08/2024   - Medications: INCREASE Basaglar  to 40 U daily and start Novolog  5 U 30 minutes before largest meal of the day - Made appt with Dr. Koval upcoming, CGM candidate? - Cannot void need Uacr next visit in 3 months  - cholesterol check on  next visit  - foot exam today  - continue statin  Preventative health care Diabetes screening: A1C today  Screening for elevated cholesterol: Ordered today Hepatitis B: N/A Syphilis if at high risk: N/A GC/CT: Previous testing in CareEverywhere  Osteoporosis screening considered based upon risk of fracture from [facility] calculator. Not yet 67 Reviewed risk factors for latent tuberculosis and not indicated UTD on MM per patient, outside of our system, UTD on pap as well  PCV today  Second shingrex rx given      Laurier Hugger, MD [facility]

## 2024-01-09 ENCOUNTER — Telehealth: Payer: Self-pay

## 2024-01-09 NOTE — Telephone Encounter (Signed)
 Patient calls nurse line reporting cold symptoms.   She reports symptoms started ~ 5 days with congestion and cough.  She reports ~ 2 days ago she started feeling dizzy. She denies any syncope episodes. She denies any nausea, vomiting or diarrhea. No fevers chills or body aches.   She reports she has not been eating or drinking very much due to low appetite.   She reports she has not checked her blood sugar this morning. She reports she is not at home and unable to check now.   Patient advised to push fluids and eat small meals and to monitor her blood sugar.   Discussed ED visit for high value CBGs with urinary frequency, excessive thirst, blurred vision and nausea vomiting.

## 2024-01-17 ENCOUNTER — Other Ambulatory Visit: Payer: Self-pay | Admitting: Student

## 2024-01-21 ENCOUNTER — Encounter (INDEPENDENT_AMBULATORY_CARE_PROVIDER_SITE_OTHER): Payer: BC Managed Care – PPO | Admitting: Ophthalmology

## 2024-01-21 DIAGNOSIS — H43813 Vitreous degeneration, bilateral: Secondary | ICD-10-CM

## 2024-01-21 DIAGNOSIS — Z7985 Long-term (current) use of injectable non-insulin antidiabetic drugs: Secondary | ICD-10-CM | POA: Diagnosis not present

## 2024-01-21 DIAGNOSIS — E113313 Type 2 diabetes mellitus with moderate nonproliferative diabetic retinopathy with macular edema, bilateral: Secondary | ICD-10-CM | POA: Diagnosis not present

## 2024-01-21 DIAGNOSIS — H35033 Hypertensive retinopathy, bilateral: Secondary | ICD-10-CM | POA: Diagnosis not present

## 2024-01-21 DIAGNOSIS — I10 Essential (primary) hypertension: Secondary | ICD-10-CM

## 2024-01-23 ENCOUNTER — Telehealth (INDEPENDENT_AMBULATORY_CARE_PROVIDER_SITE_OTHER): Payer: BC Managed Care – PPO | Admitting: Pharmacist

## 2024-01-23 ENCOUNTER — Other Ambulatory Visit (HOSPITAL_COMMUNITY): Payer: Self-pay

## 2024-01-23 DIAGNOSIS — E119 Type 2 diabetes mellitus without complications: Secondary | ICD-10-CM | POA: Diagnosis not present

## 2024-01-23 NOTE — Assessment & Plan Note (Signed)
Diabetes of ~ 3 years duration currently poorly controlled and having issues with medication access due to insurance.   -Continue basal insulin Lantus (insulin glargine) at 36 units once daily.  Need to determine amount of deductible remaining.  Patient to contact insurance and determine remaining amount of deductible.  -Also discussed option of CGM use.  Patient willing to consider.  Sensor initiation at next face-to-face visit anticipated.  -contacted her pharmacy and appears her deductible is cause of drug cost.

## 2024-01-23 NOTE — Progress Notes (Addendum)
    S:     Chief Complaint  Patient presents with   Medication Management    Diabetes - possible CGM   63 y.o. female virtual video visit with patient joining from her car and due to weather, I joined virtual video visit from home.   Visit focused on diabetes evaluation, education, and management. Patient in good spirits following working 3rd shift.   Patient was referred and last seen by Primary Care Provider, Dr. Jena Gauss, on 01/07/2023.   PMH is significant for diagnosis of ~ 3 years ago At last visit, mealtime insulin was added to her basal insulin, however patient has been unable to afford. .    Current diabetes medications include: Lantus 36 units once daily, and prescribed Novolog but unable to afford. Current hypertension medications include: amlodipine, losartan Current hyperlipidemia medications include: atorvastatin  Patient reports adherence to taking all medications as prescribed when she can afford.  However, she selected a high deductible insurance plan this year.  She does NOT know    Have you been experiencing any side effects to the medications prescribed? no Do you have any problems obtaining medications due to transportation or finances? Yes - high deductible Insurance coverage: BCBS [place]  Patient denies hypoglycemic events.  Reported home fasting blood sugars: not testing routtinely   Patient reported dietary habits: Eats 1 meals/day after working third shift.  O:   Review of Systems  All other systems reviewed and are negative.   Physical Exam - N/A virtual visit  Discussed possibility of CGM use in future - patient has I-phone 12  Lab Results  Component Value Date   HGBA1C 13.9 (A) 01/08/2024     Lipid Panel     Component Value Date/Time   CHOL 206 (H) 06/28/2022 1013   TRIG 140 06/28/2022 1013   HDL 50 06/28/2022 1013   CHOLHDL 4.1 06/28/2022 1013   LDLCALC 131 (H) 06/28/2022 1013    Clinical Atherosclerotic Cardiovascular Disease  (ASCVD): No  The 10-year ASCVD risk score (Arnett DK, et al., 2019) is: 14.1%   Values used to calculate the score:     Age: 13 years     Sex: Female     Is Non-Hispanic African American: Yes     Diabetic: Yes     Tobacco smoker: No     Systolic Blood Pressure: 120 mmHg     Is BP treated: Yes     HDL Cholesterol: 50 mg/dL     Total Cholesterol: 206 mg/dL    A/P: Diabetes of ~ 3 years duration currently poorly controlled and having issues with medication access due to insurance.   -Continue basal insulin Lantus (insulin glargine) at 36 units once daily.  Need to determine amount of deductible remaining.  Patient to contact insurance and determine remaining amount of deductible.  -Also discussed option of CGM use.  Patient willing to consider.  Sensor initiation at next face-to-face visit anticipated.  -contacted her pharmacy and appears her deductible is cause of drug cost.   ASCVD risk - primary prevention in patient with diabetes. Last LDL - prior to start of atorvastatin.   -Continued atorvastatin 40 mg.  -Lipid panel at next visit - lab evaluation.    Written patient instructions provided. Patient verbalized understanding of treatment plan.  Total time in face to face counseling 18 minutes.    Follow-up:  Pharmacist - phone 2/20

## 2024-01-23 NOTE — Patient Instructions (Signed)
Please contact insurance and determine - deductible and remaining amount of deductible for 2025.   Plan to discuss with pharmacy technician to determine co-pay options.   I plan to contact pharmacy to determine if current card is in place.

## 2024-01-24 ENCOUNTER — Telehealth: Payer: Self-pay | Admitting: Pharmacist

## 2024-01-24 NOTE — Telephone Encounter (Signed)
Patient contacted for follow-up of deductible issue and potential CGM start.   Since last contact patient reports she determined that a 3 month supply of her insulin would cost $75  (she stated she could afford this) Following some discussion we agreed to proceed with Lantus (insulin glargine) 36 units once daily until visit in 1 week.  She will pick-up and start this medication tomorrow.   At that time, we plan to initiate CGM - Libre 3 Patient asked to download Libre3 APP prior to visit.   Scheduled visit 2/28 at 8:30 AM  Total time with patient call and documentation of interaction: 13 minutes.

## 2024-01-24 NOTE — Progress Notes (Signed)
 Reviewed and agree with Dr Macky Lower plan.

## 2024-01-29 NOTE — Telephone Encounter (Signed)
 Reviewed and agree with Dr Macky Lower plan.

## 2024-02-01 ENCOUNTER — Encounter: Payer: Self-pay | Admitting: Pharmacist

## 2024-02-01 ENCOUNTER — Ambulatory Visit (INDEPENDENT_AMBULATORY_CARE_PROVIDER_SITE_OTHER): Payer: BC Managed Care – PPO | Admitting: Pharmacist

## 2024-02-01 ENCOUNTER — Other Ambulatory Visit (HOSPITAL_COMMUNITY): Payer: Self-pay

## 2024-02-01 VITALS — BP 142/68 | HR 76 | Wt 209.0 lb

## 2024-02-01 DIAGNOSIS — E119 Type 2 diabetes mellitus without complications: Secondary | ICD-10-CM

## 2024-02-01 DIAGNOSIS — Z7985 Long-term (current) use of injectable non-insulin antidiabetic drugs: Secondary | ICD-10-CM

## 2024-02-01 NOTE — Patient Instructions (Signed)
 It was nice to see you today!  Your goal blood sugar is 80-130 before eating and less than 180 after eating.  Medication Changes: Continue all other medication the same.   Keep up the good work with diet and exercise. Aim for a diet full of vegetables, fruit and lean meats (chicken, [place], fish). Try to limit salt intake by eating fresh or frozen vegetables (instead of canned), rinse canned vegetables prior to cooking and do not add any additional salt to meals.

## 2024-02-01 NOTE — Progress Notes (Addendum)
 S:     Chief Complaint  Patient presents with   Medication Management    Diabetes   63 y.o. female who presents for diabetes evaluation, education, and management. Patient arrives in good spirits and presents without any assistance.   Patient was referred and last seen by Primary Care Provider, Dr. Jena Gauss, on 01/08/24.   PMH is significant for diabetes, HTN, HLD.  At last visit, continued Lantus (insulin glargine) 36 units once daily until visit today.  Arrives today for assistance including help with CGM initiation.   Patient reports Diabetes was diagnosed in ~3 years ago.   Current diabetes medications include: Basaglar (insulin glargine) 36 units   Current hypertension medications include: amlodipine 10 mg daily, losartan 100 mg daily Current hyperlipidemia medications include: atorvastatin 40 mg daily  Patient reports adherence to taking all medications as prescribed. Patient was not able to obtain Novolog from the pharmacy due to high deductible.  Did purchase her insulin glargine.   Do you feel that your medications are working for you? yes Have you been experiencing any side effects to the medications prescribed? no Do you have any problems obtaining medications due to transportation or finances? Yes (high insurance deductible) Insurance coverage: BCBS  Patient denies hypoglycemic events.  Reported home fasting blood sugars: 150 - 160s   Patient reports nocturia (nighttime urination) (twice) Patient reports visual changes.  O:   Review of Systems  Eyes:  Positive for blurred vision.  Musculoskeletal:  Positive for joint pain (knee pain).  All other systems reviewed and are negative.   Physical Exam Vitals reviewed.  Constitutional:      Appearance: Normal appearance.  Pulmonary:     Effort: Pulmonary effort is normal.  Neurological:     Mental Status: She is alert.  Psychiatric:        Mood and Affect: Mood normal.        Behavior: Behavior normal.      Lab Results  Component Value Date   HGBA1C 13.9 (A) 01/08/2024   Vitals:   02/01/24 0833 02/01/24 0835  BP: (!) 140/71 (!) 142/68  Pulse: 76   SpO2: 100%     Lipid Panel     Component Value Date/Time   CHOL 206 (H) 06/28/2022 1013   TRIG 140 06/28/2022 1013   HDL 50 06/28/2022 1013   CHOLHDL 4.1 06/28/2022 1013   LDLCALC 131 (H) 06/28/2022 1013    Clinical Atherosclerotic Cardiovascular Disease (ASCVD): No  The 10-year ASCVD risk score (Arnett DK, et al., 2019) is: 22.1%   Values used to calculate the score:     Age: 57 years     Sex: Female     Is Non-Hispanic African American: Yes     Diabetic: Yes     Tobacco smoker: No     Systolic Blood Pressure: 142 mmHg     Is BP treated: Yes     HDL Cholesterol: 50 mg/dL     Total Cholesterol: 206 mg/dL   A/P: Diabetes longstanding currently poorly controlled (A1c 13.9%). Patient is able to verbalize appropriate hypoglycemia management plan. Medication adherence appears good. Control is suboptimal due to high deductible preventing patient access to insulin medications and stress.  -Continued basal insulin Basaglar (insulin glargine) 36 units daily.  -Hold rapid insulin Novolog (insulin aspart) 5 units once daily with the largest meal -A new Libre 3 Plus sensor was applied on patient's left arm - prescription sent for PA/ cost (Test claim revealed [facility] preferred -  determined after end of visit)  Plan to transition to Dexcom at follow-up and submit claim) -Patient educated on purpose, proper use, and potential adverse effects.  -Extensively discussed pathophysiology of diabetes, recommended lifestyle interventions, dietary effects on blood sugar control.  -Counseled on s/sx of and management of hypoglycemia.   ASCVD risk - primary prevention in patient with diabetes. Last LDL is 131 not at goal of <16 mg/dL. ASCVD risk factors include age, HTN, diabetes and 10-year ASCVD risk score of 22.1%.  -Continued atorvastatin 40 mg  daily -repeat lipid panel at next blood draw -  last 2023 LDL not at goal  Hypertension longstanding currently improved. Blood pressure goal of <130/80 mmHg. Medication adherence good.  -Continued amlodipine 10 mg daily, losartan 100 mg daily -UACR at upcoming appointment  Written patient instructions provided. Patient verbalized understanding of treatment plan.  Total time in face to face counseling 24 minutes.    Follow-up:  Pharmacist 03/18 at 8:30 a.m. PCP clinic visit in 03/18 with Dr. Jena Gauss Patient seen with Lavona Mound, PharmD Candidate

## 2024-02-01 NOTE — Assessment & Plan Note (Signed)
 Diabetes longstanding currently poorly controlled (A1c 13.9%). Patient is able to verbalize appropriate hypoglycemia management plan. Medication adherence appears good. Control is suboptimal due to high deductible preventing patient access to insulin medications and stress.  -Continued basal insulin Basaglar (insulin glargine) 36 units daily.  -Hold rapid insulin Novolog (insulin aspart) 5 units once daily with the largest meal -A new Libre 3 Plus sensor was applied on patient's left arm - prescription sent for PA/ cost (Test claim revealed DEXCOM preferred - determined after end of visit)  Plan to transition to Dexcom at follow-up and submit claim) -Patient educated on purpose, proper use, and potential adverse effects.  -Extensively discussed pathophysiology of diabetes, recommended lifestyle interventions, dietary effects on blood sugar control.

## 2024-02-04 NOTE — Progress Notes (Signed)
 Reviewed and agree with Dr Macky Lower plan.

## 2024-02-15 ENCOUNTER — Ambulatory Visit: Payer: BC Managed Care – PPO | Admitting: Pharmacist

## 2024-02-18 ENCOUNTER — Encounter (INDEPENDENT_AMBULATORY_CARE_PROVIDER_SITE_OTHER): Payer: BC Managed Care – PPO | Admitting: Ophthalmology

## 2024-02-18 DIAGNOSIS — H43813 Vitreous degeneration, bilateral: Secondary | ICD-10-CM

## 2024-02-18 DIAGNOSIS — Z7985 Long-term (current) use of injectable non-insulin antidiabetic drugs: Secondary | ICD-10-CM | POA: Diagnosis not present

## 2024-02-18 DIAGNOSIS — E113313 Type 2 diabetes mellitus with moderate nonproliferative diabetic retinopathy with macular edema, bilateral: Secondary | ICD-10-CM

## 2024-02-18 DIAGNOSIS — I10 Essential (primary) hypertension: Secondary | ICD-10-CM | POA: Diagnosis not present

## 2024-02-18 DIAGNOSIS — H35033 Hypertensive retinopathy, bilateral: Secondary | ICD-10-CM

## 2024-02-19 ENCOUNTER — Ambulatory Visit (INDEPENDENT_AMBULATORY_CARE_PROVIDER_SITE_OTHER): Payer: BC Managed Care – PPO | Admitting: Pharmacist

## 2024-02-19 ENCOUNTER — Ambulatory Visit: Payer: BC Managed Care – PPO | Admitting: Student

## 2024-02-19 ENCOUNTER — Encounter: Payer: Self-pay | Admitting: Pharmacist

## 2024-02-19 ENCOUNTER — Encounter: Payer: Self-pay | Admitting: Student

## 2024-02-19 VITALS — BP 137/73 | HR 76 | Ht 66.0 in | Wt 210.0 lb

## 2024-02-19 VITALS — BP 137/73 | Wt 210.6 lb

## 2024-02-19 DIAGNOSIS — Z0000 Encounter for general adult medical examination without abnormal findings: Secondary | ICD-10-CM | POA: Diagnosis not present

## 2024-02-19 DIAGNOSIS — Z794 Long term (current) use of insulin: Secondary | ICD-10-CM | POA: Diagnosis not present

## 2024-02-19 DIAGNOSIS — E119 Type 2 diabetes mellitus without complications: Secondary | ICD-10-CM

## 2024-02-19 DIAGNOSIS — E042 Nontoxic multinodular goiter: Secondary | ICD-10-CM | POA: Diagnosis not present

## 2024-02-19 DIAGNOSIS — E7800 Pure hypercholesterolemia, unspecified: Secondary | ICD-10-CM

## 2024-02-19 DIAGNOSIS — I10 Essential (primary) hypertension: Secondary | ICD-10-CM

## 2024-02-19 MED ORDER — BASAGLAR KWIKPEN 100 UNIT/ML ~~LOC~~ SOPN: 44.0000 [IU] | PEN_INJECTOR | Freq: Every day | SUBCUTANEOUS | Status: DC

## 2024-02-19 MED ORDER — LOSARTAN POTASSIUM 100 MG PO TABS
100.0000 mg | ORAL_TABLET | Freq: Every day | ORAL | 0 refills | Status: AC
Start: 2024-02-19 — End: ?

## 2024-02-19 MED ORDER — DEXCOM G7 SENSOR MISC: 1.0000 | 11 refills | Status: AC

## 2024-02-19 MED ORDER — AMLODIPINE BESYLATE 10 MG PO TABS: 10.0000 mg | ORAL_TABLET | Freq: Every day | ORAL | 3 refills | Status: AC

## 2024-02-19 NOTE — Addendum Note (Signed)
 Addended by: Jennette Bill on: 02/19/2024 11:17 AM   Modules accepted: Orders

## 2024-02-19 NOTE — Assessment & Plan Note (Signed)
 Blood glucose levels in the 300s. Insulin dosage increased to manage hyperglycemia. Target CBG is 150. - Increase insulin dosage as per Koval's plan. - Follow up with Koval in two weeks to assess insulin regimen effectiveness.

## 2024-02-19 NOTE — Patient Instructions (Signed)
 It was nice to see you today!  Your goal blood sugar is 80-130 before eating and less than 180 after eating.  Medication Changes  Increase Basaglar (insulin glargine) to 44 units daily. Increase by 1 unit daily up till 50 units daily or if blood sugar is <150 then stop at that dose.    Continue all other medication the same.   Keep up the good work with diet and exercise. Aim for a diet full of vegetables, fruit and lean meats (chicken, [place], fish). Try to limit salt intake by eating fresh or frozen vegetables (instead of canned), rinse canned vegetables prior to cooking and do not add any additional salt to meals.

## 2024-02-19 NOTE — Assessment & Plan Note (Signed)
 Continue with current medication regimen. Controlled.

## 2024-02-19 NOTE — Assessment & Plan Note (Signed)
 No new sx, see above for last U/S. Saw Kerr in the past, unable to see results from [place]. Recheck TSH with reflex today. TPO ab ordered.

## 2024-02-19 NOTE — Progress Notes (Signed)
    SUBJECTIVE:   CHIEF COMPLAINT / HPI:   Routine Follow Up: - The patient, with a history of diabetes, hypertension, and goiter, presents for a routine follow-up.  - She reports that her blood glucose levels have been running in the three hundreds, despite recent adjustments to her insulin regimen.  - She is scheduled to see Koval in two weeks for further management.  - She is currently on antidepressant, two antihypertensives, insulin, and atorvastatin. She has recently received the first dose of the shingles vaccine and is considering the second dose.  - The patient also reports having regular eye checks due to her diabetes and has recently had a mammogram, which was normal. She has not been attending therapy recently, but has been given the option to return if she wishes.  PERTINENT  PMH / PSH:  GERD  DM2--A1C 13.9 01/2024, Uses Basaglar 44 U daily with one unit increase until reaches 50 U  On statin, STOPPED GLP1 2/2 lack of insurance coverage, has Libre 3  Has follow up appt with Dr. Raymondo Band next week  HLD--Atorvastatin 40 mg daily  Depressed mood, lost mom recently--sees therapist + Paroxetine Colon Polyps--UTD on colonoscopy  Healthcare Maintenance needs-- HIV, Hep C, Foot exam, shingrix 2/2 HTN--Amlodipine 10 mg, losartan 100 mg daily  Thyroid Goiter--  Thyroid U/S 09/26/22:  1. Similar findings of thyromegaly and multinodular goiter. 2. Previously biopsied dominant right-sided thyroid nodules/masses have increased in size compared to remote examination performed in 2011 with nodule labeled #1 now measuring 4.6 cm, previously, 3.1 cm, and nodule labeled #2 now measuring 5.4 cm, previously 3.0 cm, likely explaining the patient's subjective complaint worsening thyromegaly. Correlation with previous biopsy results is advised. Assuming a benign pathologic diagnosis, repeat sampling and/or continued dedicated follow-up is not recommended, even in spite of interval growth as  benign nodules can demonstrate growth. OBJECTIVE:   BP 137/73   Pulse 76   Ht 5\' 6"  (1.676 m)   Wt 210 lb (95.3 kg)   BMI 33.89 kg/m   General: Alert and oriented in no apparent distress HEENT: NTTP over thyroid, goiter present  Heart: Regular rate and rhythm with no murmurs appreciated Lungs: CTA bilaterally, no wheezing Abdomen: no abdominal pain Skin: Warm and dry   ASSESSMENT/PLAN:   Assessment & Plan Multinodular goiter (nontoxic) No new sx, see above for last U/S. Saw Kerr in the past, unable to see results from [place]. Recheck TSH with reflex today. TPO ab ordered.  Diabetes mellitus without complication (HCC) Blood glucose levels in the 300s. Insulin dosage increased to manage hyperglycemia. Target CBG is 150. - Increase insulin dosage as per Koval's plan. - Follow up with Koval in two weeks to assess insulin regimen effectiveness. Hypertension, unspecified type Continue with current medication regimen. Controlled.  Healthcare maintenance Received one dose of shingles vaccine. Discussed potential side effects. Last mammogram normal. No STD concerns. Non-smoker. - Considering next shingles vaccine at convenience. - Continue routine health screenings. Elevated cholesterol On atorvastatin. Cholesterol levels to be rechecked. - Recheck cholesterol levels.      Allison Martinez, MD [facility]

## 2024-02-19 NOTE — Assessment & Plan Note (Signed)
 Received one dose of shingles vaccine. Discussed potential side effects. Last mammogram normal. No STD concerns. Non-smoker. - Considering next shingles vaccine at convenience. - Continue routine health screenings.

## 2024-02-19 NOTE — Assessment & Plan Note (Signed)
 Diabetes longstanding currently uncontrolled. Patient is able to verbalize appropriate hypoglycemia management plan. Medication adherence appears adherent.  -Increased dose of basal insulin Basaglar (insulin glargine) from 44 units to 50 units daily in the morning. Patient will continue to titrate 1 unit every day if fasting blood sugar < 150 mg/dl until fasting blood sugars reach goal or next visit.  -Start Dexcom G7 sensor. Changed from [place] to [facility] due to patient insurance. Placed 1 sample Dexcom sensor today. -Extensively discussed pathophysiology of diabetes, recommended lifestyle interventions, dietary effects on blood sugar control.  -Counseled on s/sx of and management of hypoglycemia.  -Next A1c anticipated 04/2024.

## 2024-02-19 NOTE — Progress Notes (Signed)
 S:     Chief Complaint  Patient presents with   Medication Management    Diabetes Follow Up   63 y.o. female who presents for diabetes evaluation, education, and management. Patient arrives in decent spirits and presents without any assistance. Of note, patient is grieving the death of her mother who she was close with.  Patient was referred and last seen by me on 02/01/2024. we started a CGM at the last visit and Basaglar (insulin glarine) 36 units was continued.   PMH is significant for HTN, HLD, T2DM.  At last visit, we started a CGM at the last visit and Basaglar (insulin glarine) 36 units was continued. Diabetes was diagnosed around 3 years ago.   Patient reports Diabetes was diagnosed in 3 years ago.   Current diabetes medications include: Basaglar (insulin glargine) 36 units daily Current hypertension medications include: losartan 100 mg daily, amlodipine 10 mg daily Current hyperlipidemia medications include: atorvastatin 40 mg daily  Patient reports adherence to taking all medications as prescribed.  Do you have any problems obtaining medications due to transportation or finances? Yes. High deductible health plan has made it difficult to afford Basaglar.  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   All other systems reviewed and are negative.   Physical Exam Psychiatric:        Behavior: Behavior normal.        Thought Content: Thought content normal.        Judgment: Judgment normal.      Libre3 CGM Download today 02/03/24-02/16/24 % Time CGM is active: 96% Average Glucose: 310 mg/dL Glucose Management Indicator: 10.7  Glucose  Variability: 22.7% (goal <36%) Time in Goal:  - Time in range 70-180: 3% - Time above range: 97% - Time below range: 0% Observed patterns: No signs of hypoglycemia in report   Lab Results  Component Value Date   HGBA1C 13.9 (A) 01/08/2024   Vitals:   02/19/24 0830  BP: 137/73    Lipid Panel     Component Value Date/Time   CHOL 206 (H) 06/28/2022 1013   TRIG 140 06/28/2022 1013   HDL 50 06/28/2022 1013   CHOLHDL 4.1 06/28/2022 1013   LDLCALC 131 (H) 06/28/2022 1013    Clinical Atherosclerotic Cardiovascular Disease (ASCVD): No  The 10-year ASCVD risk score (Arnett DK, et al., 2019) is: 20.1%   Values used to calculate the score:     Age: 37 years     Sex: Female     Is Non-Hispanic African American: Yes     Diabetic: Yes     Tobacco smoker: No     Systolic Blood Pressure: 137 mmHg     Is BP treated: Yes     HDL Cholesterol: 50 mg/dL     Total Cholesterol: 206 mg/dL    A/P: Diabetes longstanding currently uncontrolled. Patient is able to verbalize appropriate hypoglycemia management plan. Medication adherence appears adherent.  -Increased dose of basal insulin Basaglar (insulin glargine) from 44 units to 50 units daily in the morning. Patient will continue to titrate 1 unit every day if fasting blood sugar < 150 mg/dl until fasting blood sugars reach goal or next visit.  -  Start Dexcom G7 sensor. Changed from [place] to [facility] due to patient insurance. Placed 1 sample Dexcom sensor today. -Extensively discussed pathophysiology of diabetes, recommended lifestyle interventions, dietary effects on blood sugar control.  -Counseled on s/sx of and management of hypoglycemia.  -Next A1c anticipated 04/2024.   ASCVD risk - primary prevention in patient with diabetes. Last LDL is 131 not at goal of <10 mg/dL. ASCVD risk factors include HTN, T2DM, HLD and 10-year ASCVD risk score of 20.1%. high intensity statin indicated.  -Continued atorvastatin 40 mg.   Hypertension  longstanding currently uncontrolled. Blood pressure goal of <130/80 mmHg. Medication adherence reports adherence.Of note, patient didn't take BP meds before visit today as she is just getting off work. -Continued amlodipine 10 mg  -Continued losartan 100 mg   Depression/Grief Reaction -Patient mentioned she was not certain if she should continue to take paroxetine to help with grief associated with her mother's death. Counseled patient to avoid stopping the medication without a taper first.  Plan to discuss with PCP.    Written patient instructions provided. Patient verbalized understanding of treatment plan.  Total time in face to face counseling 52 minutes.    Follow-up:  Pharmacist 02/29/2024 Patient seen with Mack Guise, PharmD Candidate.

## 2024-02-19 NOTE — Patient Instructions (Addendum)
 It was great to see you today! Thank you for choosing [facility] for your primary care.  Today we addressed: Continue with current medication regimen We will check some labs today  Return in about 3 months   If you haven't already, sign up for My Chart to have easy access to your labs results, and communication with your primary care physician.   Please arrive 15 minutes before your appointment to ensure smooth check in process.  We appreciate your efforts in making this happen.  Thank you for allowing me to participate in your care, Alfredo Martinez, MD 02/19/2024, 10:01 AM PGY-3, [facility]

## 2024-02-21 NOTE — Progress Notes (Signed)
 Reviewed and agree with Dr Macky Lower plan.

## 2024-02-28 ENCOUNTER — Encounter: Payer: Self-pay | Admitting: Pharmacist

## 2024-02-28 ENCOUNTER — Ambulatory Visit: Admitting: Pharmacist

## 2024-02-28 ENCOUNTER — Other Ambulatory Visit (HOSPITAL_COMMUNITY): Payer: Self-pay

## 2024-02-28 VITALS — BP 138/66 | HR 73 | Wt 208.6 lb

## 2024-02-28 DIAGNOSIS — E119 Type 2 diabetes mellitus without complications: Secondary | ICD-10-CM

## 2024-02-28 MED ORDER — MOUNJARO 5 MG/0.5ML ~~LOC~~ SOAJ: 5.0000 mg | SUBCUTANEOUS | 0 refills | Status: DC

## 2024-02-28 MED ORDER — MOUNJARO 2.5 MG/0.5ML ~~LOC~~ SOAJ: 2.5000 mg | SUBCUTANEOUS | Status: DC

## 2024-02-28 NOTE — Progress Notes (Signed)
 S:     Chief Complaint  Patient presents with  . Medication Management    Diabetes Follow Up   63 y.o. female who presents for diabetes evaluation, education, and management. Patient arrives in grieving spirits and presents without any assistance.   Patient was referred and last seen by Primary Care Provider, Dr. Jena Gauss, on 02/19/2024.   PMH is significant for T2DM, HTN, HLD.  At last visit, Basaglar (insulin glargine) was increased to 44-50 units (the patient is currently on 47 units).   Patient reports Diabetes was diagnosed 3 years ago.    Current diabetes medications include: Basaglar (insulin glargine) 47 units daily Current hypertension medications include: amlodipine 10 mg, losartan 100 mg Current hyperlipidemia medications include: atorvastatin 40 mg   Patient reports adherence to taking all medications as prescribed.   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 Thought to be due to compression lows from sleeping on her side at night.  Patient-reported exercise habits: reports walking M-F around her neighborhood. Encouraged 150 minutes of moderate intensity exercise.   O:   Review of Systems  Constitutional:  Positive for malaise/fatigue.  Psychiatric/Behavioral:  Positive for depression (Patient is grieving the loss of her mother).   All other systems reviewed and are negative.   Physical Exam Constitutional:      Appearance: Normal appearance.  Pulmonary:     Effort: Pulmonary effort is normal.  Neurological:     Mental Status: She is alert.  Psychiatric:        Thought Content: Thought content normal.        Judgment: Judgment normal.   7 day average blood glucose: 310  G7 CGM Download today 02/15/2024-02/28/2024 % Time CGM is active: 100% Average Glucose: 310 mg/dL Glucose Management Indicator: 10.7  Glucose Variability: 27.3% (goal <36%) Time in Goal:  - Time in range 70-180: 11% - Time above range: 89% -  Time below range: 0% Observed patterns:   Lab Results  Component Value Date   HGBA1C 13.9 (A) 01/08/2024   Vitals:   02/28/24 0838  BP: 138/66  Pulse: 73  SpO2: 100%    Lipid Panel     Component Value Date/Time   CHOL 206 (H) 06/28/2022 1013   TRIG 140 06/28/2022 1013   HDL 50 06/28/2022 1013   CHOLHDL 4.1 06/28/2022 1013   LDLCALC 131 (H) 06/28/2022 1013    Clinical Atherosclerotic Cardiovascular Disease (ASCVD): No  The 10-year ASCVD risk score (Arnett DK, et al., 2019) is: 20.5%   Values used to calculate the score:     Age: 73 years     Sex: Female     Is Non-Hispanic African American: Yes     Diabetic: Yes     Tobacco smoker: No     Systolic Blood Pressure: 138 mmHg     Is BP treated: Yes     HDL Cholesterol: 50 mg/dL     Total Cholesterol: 206 mg/dL   A/P: Diabetes longstanding currently improved control. Patient is  able to verbalize appropriate hypoglycemia management plan. Medication adherence appears adherent.  -Continued basal insulin Basaglar(insulin glargine) titrate up to 50 units, patient is currently on 47 units. Patient will continue to titrate 1 unit every days if fasting blood sugar > 100mg /dl until fasting blood sugars reach goal or next visit.  -Added GLP-1 Mounjaro (tirzepatide) from 2.5 mg weekly. A sample was given in clinic today and a script for the next dose was sent to her pharmacy.  -  Patient educated on purpose, proper use, and potential adverse effects of nausea/vomiting. Encouraged patient to eat Mounjaro (tirzepatide) with a balanced meal.  -Extensively discussed pathophysiology of diabetes, recommended lifestyle interventions, dietary effects on blood sugar control.  -Counseled on s/sx of and management of hypoglycemia.  -Next A1c anticipated 04/06/2024.   ASCVD risk - primary prevention in patient with diabetes. Last LDL is not at goal of <70 mg/dL. ASCVD risk factors include HTN, HLD, T2DM and 10-year ASCVD risk score of 20.5%.   -Continued atorvastatin 40 mg Last lipid panel 2023 - consider next lab draw  Hypertension longstanding currently controlled and at goal of <130/80 mmHg. Medication adherence good. -Continued losartan 100 mg -Continued amlodipine 10 mg   Written patient instructions provided. Patient verbalized understanding of treatment plan.  Total time in face to face counseling 48 minutes.    Follow-up:  Pharmacist 04/01/2024 Patient seen with Threasa Heads, PharmD Candidate and Mack Guise, PharmD Candidate.

## 2024-02-28 NOTE — Progress Notes (Signed)
 Reviewed and agree with Dr Macky Lower plan.

## 2024-02-28 NOTE — Patient Instructions (Addendum)
 It was nice to see you today!  Your goal blood sugar is 80-130 before eating and less than 180 after eating.  Medication Changes: START Mounjaro 2.5 mg weekly.   Mounjaro 5 mg sent to your pharmacy, pick this up after you finish the 2.5 mg dose. Pick up Dexcom Sensor from your pharmacy   Continue all other medication the same.    Keep up the good work with diet and exercise. Aim for a diet full of vegetables, fruit and lean meats (chicken, [place], fish). Try to limit salt intake by eating fresh or frozen vegetables (instead of canned), rinse canned vegetables prior to cooking and do not add any additional salt to meals.

## 2024-02-28 NOTE — Assessment & Plan Note (Signed)
 Diabetes longstanding currently improved control. Patient is  able to verbalize appropriate hypoglycemia management plan. Medication adherence appears adherent.  -Continued basal insulin Basaglar(insulin glargine) titrate up to 50 units, patient is currently on 47 units. Patient will continue to titrate 1 unit every days if fasting blood sugar > 100mg /dl until fasting blood sugars reach goal or next visit.  -Added GLP-1 Mounjaro (tirzepatide) from 2.5 mg weekly. A sample was given in clinic today and a script for the next dose was sent to her pharmacy.  -Patient educated on purpose, proper use, and potential adverse effects of nausea/vomiting. Encouraged patient to eat Mounjaro (tirzepatide) with a balanced meal.  -Extensively discussed pathophysiology of diabetes, recommended lifestyle interventions, dietary effects on blood sugar control.  -Counseled on s/sx of and management of hypoglycemia.

## 2024-03-03 NOTE — Telephone Encounter (Signed)
 Allison Lopez

## 2024-03-24 ENCOUNTER — Encounter (INDEPENDENT_AMBULATORY_CARE_PROVIDER_SITE_OTHER): Admitting: Ophthalmology

## 2024-03-24 DIAGNOSIS — Z7985 Long-term (current) use of injectable non-insulin antidiabetic drugs: Secondary | ICD-10-CM

## 2024-03-24 DIAGNOSIS — E113313 Type 2 diabetes mellitus with moderate nonproliferative diabetic retinopathy with macular edema, bilateral: Secondary | ICD-10-CM | POA: Diagnosis not present

## 2024-03-24 DIAGNOSIS — H35033 Hypertensive retinopathy, bilateral: Secondary | ICD-10-CM | POA: Diagnosis not present

## 2024-03-24 DIAGNOSIS — H43813 Vitreous degeneration, bilateral: Secondary | ICD-10-CM

## 2024-03-24 DIAGNOSIS — I10 Essential (primary) hypertension: Secondary | ICD-10-CM

## 2024-03-25 ENCOUNTER — Telehealth: Payer: Self-pay

## 2024-03-25 NOTE — Telephone Encounter (Signed)
 Patient LVM on nurse line requesting sample of Dexcom sensor. She reports that sensor fell off and she only has 1 left.   Will forward to Dr. Koval regarding sample availability.   Elsie Halo, RN

## 2024-03-26 NOTE — Telephone Encounter (Signed)
 Patient contacted for follow-up of request for CGM - Dexcom G7 sensor.    Since last contact patient reports her glucose readings have been doing well.  She reports changes to her diet and her exercise routine have helped with glucose readings.   Review of her CGM showed: GMI - 7.6 Avg 179 TIR 56%  Informed patient of option to request replacement sensors from manufacturer with any sensor dysfunction/failure in the future.   Sample sensor placed at [facility] front desk for pick-up.    Total time with patient call and documentation of interaction: 9 minutes.

## 2024-03-27 NOTE — Telephone Encounter (Signed)
 Reviewed and agree with Dr Macky Lower plan.

## 2024-04-01 ENCOUNTER — Encounter: Payer: Self-pay | Admitting: Pharmacist

## 2024-04-01 ENCOUNTER — Ambulatory Visit: Admitting: Pharmacist

## 2024-04-01 VITALS — BP 131/71 | HR 77 | Wt 207.8 lb

## 2024-04-01 DIAGNOSIS — Z7985 Long-term (current) use of injectable non-insulin antidiabetic drugs: Secondary | ICD-10-CM

## 2024-04-01 DIAGNOSIS — E119 Type 2 diabetes mellitus without complications: Secondary | ICD-10-CM

## 2024-04-01 MED ORDER — DEXCOM G7 SENSOR MISC: 1.0000 | 11 refills | Status: AC

## 2024-04-01 MED ORDER — BASAGLAR KWIKPEN 100 UNIT/ML ~~LOC~~ SOPN: 40.0000 [IU] | PEN_INJECTOR | Freq: Every day | SUBCUTANEOUS | Status: DC

## 2024-04-01 NOTE — Progress Notes (Signed)
 Reviewed and agree with Dr Macky Lower plan.

## 2024-04-01 NOTE — Patient Instructions (Signed)
 It was nice to see you today!  Your goal blood sugar is 80-130 before eating and less than 180 after eating.  Medication Changes: Increase Mounjaro from 2.5 mg to 5 mg weekly  Decrease Basaglar  from 47 units to 40 units daily  Continue all other medication the same.   Keep up the good work with diet and exercise. Aim for a diet full of vegetables, fruit and lean meats (chicken, [place], fish). Try to limit salt intake by eating fresh or frozen vegetables (instead of canned), rinse canned vegetables prior to cooking and do not add any additional salt to meals.

## 2024-04-01 NOTE — Progress Notes (Signed)
 S:     Chief Complaint  Patient presents with   Medication Management    Diabetes follow-up   63 y.o. female who presents for diabetes evaluation, education, and management. Patient arrives in good spirits and presents without any assistance.  Patient was referred and last seen by Primary Care Provider, Dr. Eveleen Hinds, on 02/19/24.   PMH is significant for T2DM, HTN, HLD.  At last visit, patient started Mounjaro (tirzepatide) 2.5 mg weekly.   Patient reports Diabetes was diagnosed ~3 years ago.   Current diabetes medications include: Basaglar  (insulin  glargine) 47 units daily, Mounjaro (tirzepatide) 2.5 mg weekly (on Sunday) Current hypertension medications include: amlodipine  10 mg, losartan  100 mg Current hyperlipidemia medications include: atorvastatin  40 mg  Patient reports adherence to taking all medications as prescribed.   Do you feel that your medications are working for you? yes Have you been experiencing any side effects to the medications prescribed? no Do you have any problems obtaining medications due to transportation or finances? no Insurance coverage: [employer]  Patient reports several hypoglycemic events. Reports occasional lows midday to late afternoon.  O:   Review of Systems  All other systems reviewed and are negative.   Physical Exam Vitals reviewed.  Constitutional:      Appearance: Normal appearance.  Pulmonary:     Effort: Pulmonary effort is normal.  Neurological:     Mental Status: She is alert.  Psychiatric:        Mood and Affect: Mood normal.        Behavior: Behavior normal.        Thought Content: Thought content normal.        Judgment: Judgment normal.    Dexcom G7 CGM Download today 04/01/24 % Time CGM is active: 62% Average Glucose: 162 mg/dL Glucose Management Indicator: 7.2  Glucose Variability: 31.2% (goal <36%) Time in Goal:  - Time in range 70-180: 68% - Time above range: 31% - Time below range:  1% Observed patterns: Moderate variability. Reports occasional hypoglycemic events midday to late afternoon and late night hyperglycemic events following snacking  Lab Results  Component Value Date   HGBA1C 13.9 (A) 01/08/2024   Vitals:   04/01/24 0846  BP: 131/71  Pulse: 77  SpO2: 98%    Lipid Panel     Component Value Date/Time   CHOL 206 (H) 06/28/2022 1013   TRIG 140 06/28/2022 1013   HDL 50 06/28/2022 1013   CHOLHDL 4.1 06/28/2022 1013   LDLCALC 131 (H) 06/28/2022 1013    Clinical Atherosclerotic Cardiovascular Disease (ASCVD): No  The 10-year ASCVD risk score (Arnett DK, et al., 2019) is: 17.9%   Values used to calculate the score:     Age: 70 years     Sex: Female     Is Non-Hispanic African American: Yes     Diabetic: Yes     Tobacco smoker: No     Systolic Blood Pressure: 131 mmHg     Is BP treated: Yes     HDL Cholesterol: 50 mg/dL     Total Cholesterol: 206 mg/dL   A/P: Diabetes longstanding ~3 years currently improved based on GMI of 7.2   Patient is able to verbalize appropriate hypoglycemia management plan. Medication adherence appears good. Control is suboptimal due to moderate variability following diet and lifestyle. -Weight goal: <200 lb (by July 4th) -Decreased dose of basal insulin  Basaglar  (insulin  glargine) from 47 units to 40 units daily in the morning.   -Increased  dose of GLP-1 Mounjaro (tirzepatide) from 2.5 mg to 5 mg.  -Patient educated on purpose, proper use, and potential adverse effects.  -Extensively discussed pathophysiology of diabetes, recommended lifestyle interventions, dietary effects on blood sugar control.  -Counseled on s/sx of and management of hypoglycemia.  -Next A1c anticipated May 2025 (at next appointment).   Written patient instructions provided. Patient verbalized understanding of treatment plan.  Total time in face to face counseling 24 minutes.    Follow-up:  Pharmacist PRN PCP clinic visit in 04/24/24 Patient seen  with Teofilo Fellers, PharmD Candidate and Jeanine Millers, PharmD Candidate.

## 2024-04-01 NOTE — Assessment & Plan Note (Signed)
 Diabetes longstanding ~3 years currently improved based on GMI of 7.2   Patient is able to verbalize appropriate hypoglycemia management plan. Medication adherence appears good. Control is suboptimal due to moderate variability following diet and lifestyle. -Weight goal: <200 lb (by July 4th) -Decreased dose of basal insulin  Basaglar  (insulin  glargine) from 47 units to 40 units daily in the morning.   -Increased dose of GLP-1 Mounjaro (tirzepatide) from 2.5 mg to 5 mg.  -Patient educated on purpose, proper use, and potential adverse effects.  -Extensively discussed pathophysiology of diabetes, recommended lifestyle interventions, dietary effects on blood sugar control.  -Counseled on s/sx of and management of hypoglycemia.  -Next A1c anticipated May 2025 (at next appointment).

## 2024-04-21 ENCOUNTER — Encounter (INDEPENDENT_AMBULATORY_CARE_PROVIDER_SITE_OTHER): Admitting: Ophthalmology

## 2024-04-21 DIAGNOSIS — E113313 Type 2 diabetes mellitus with moderate nonproliferative diabetic retinopathy with macular edema, bilateral: Secondary | ICD-10-CM

## 2024-04-21 DIAGNOSIS — I10 Essential (primary) hypertension: Secondary | ICD-10-CM | POA: Diagnosis not present

## 2024-04-21 DIAGNOSIS — H35033 Hypertensive retinopathy, bilateral: Secondary | ICD-10-CM | POA: Diagnosis not present

## 2024-04-21 DIAGNOSIS — Z7985 Long-term (current) use of injectable non-insulin antidiabetic drugs: Secondary | ICD-10-CM

## 2024-04-21 DIAGNOSIS — H43813 Vitreous degeneration, bilateral: Secondary | ICD-10-CM

## 2024-04-24 ENCOUNTER — Ambulatory Visit: Admitting: Student

## 2024-04-24 NOTE — Progress Notes (Deleted)
    SUBJECTIVE:   CHIEF COMPLAINT / HPI:   Type 2 Diabetes: Home medications include: Basaglar  (insulin  glargine) 47 units daily, Mounjaro (tirzepatide) 5 mg weekly (on Sunday). Does endorse compliance. Home glucose monitoring {home testing:315145}. Patient {rwisisnot:[id number]} up to date on diabetic eye. Most recent A1Cs:  Lab Results  Component Value Date   HGBA1C 13.9 (A) 01/08/2024   HGBA1C 8.2 (A) 06/15/2023   Last Microalbumin, LDL, Creatinine: Lab Results  Component Value Date   LDLCALC 131 (H) 06/28/2022   CREATININE 1.33 (H) 11/09/2023      PERTINENT  PMH / PSH: ***  OBJECTIVE:   There were no vitals taken for this visit.  ***  ASSESSMENT/PLAN:   Assessment & Plan      Ernestina Headland, MD [facility]

## 2024-05-12 ENCOUNTER — Other Ambulatory Visit: Payer: Self-pay | Admitting: Family Medicine

## 2024-05-12 DIAGNOSIS — E119 Type 2 diabetes mellitus without complications: Secondary | ICD-10-CM

## 2024-05-17 ENCOUNTER — Other Ambulatory Visit: Payer: Self-pay | Admitting: Student

## 2024-05-17 DIAGNOSIS — I10 Essential (primary) hypertension: Secondary | ICD-10-CM

## 2024-05-17 DIAGNOSIS — E119 Type 2 diabetes mellitus without complications: Secondary | ICD-10-CM

## 2024-05-26 ENCOUNTER — Encounter (INDEPENDENT_AMBULATORY_CARE_PROVIDER_SITE_OTHER): Admitting: Ophthalmology

## 2024-05-26 ENCOUNTER — Ambulatory Visit: Admitting: Student

## 2024-05-26 DIAGNOSIS — E113313 Type 2 diabetes mellitus with moderate nonproliferative diabetic retinopathy with macular edema, bilateral: Secondary | ICD-10-CM

## 2024-05-26 DIAGNOSIS — I10 Essential (primary) hypertension: Secondary | ICD-10-CM | POA: Diagnosis not present

## 2024-05-26 DIAGNOSIS — Z7985 Long-term (current) use of injectable non-insulin antidiabetic drugs: Secondary | ICD-10-CM | POA: Diagnosis not present

## 2024-05-26 DIAGNOSIS — H35033 Hypertensive retinopathy, bilateral: Secondary | ICD-10-CM

## 2024-05-26 DIAGNOSIS — H43813 Vitreous degeneration, bilateral: Secondary | ICD-10-CM

## 2024-06-04 ENCOUNTER — Ambulatory Visit (INDEPENDENT_AMBULATORY_CARE_PROVIDER_SITE_OTHER)

## 2024-06-04 ENCOUNTER — Ambulatory Visit: Payer: Self-pay

## 2024-06-04 ENCOUNTER — Ambulatory Visit (INDEPENDENT_AMBULATORY_CARE_PROVIDER_SITE_OTHER): Admitting: Pharmacist

## 2024-06-04 ENCOUNTER — Encounter: Payer: Self-pay | Admitting: Pharmacist

## 2024-06-04 VITALS — BP 141/71 | HR 77 | Wt 202.0 lb

## 2024-06-04 VITALS — BP 141/71 | HR 77 | Ht 66.0 in | Wt 202.0 lb

## 2024-06-04 DIAGNOSIS — E042 Nontoxic multinodular goiter: Secondary | ICD-10-CM

## 2024-06-04 DIAGNOSIS — E119 Type 2 diabetes mellitus without complications: Secondary | ICD-10-CM | POA: Diagnosis not present

## 2024-06-04 DIAGNOSIS — I10 Essential (primary) hypertension: Secondary | ICD-10-CM | POA: Diagnosis not present

## 2024-06-04 DIAGNOSIS — E1169 Type 2 diabetes mellitus with other specified complication: Secondary | ICD-10-CM | POA: Diagnosis not present

## 2024-06-04 DIAGNOSIS — E785 Hyperlipidemia, unspecified: Secondary | ICD-10-CM | POA: Diagnosis not present

## 2024-06-04 DIAGNOSIS — Z1159 Encounter for screening for other viral diseases: Secondary | ICD-10-CM | POA: Diagnosis not present

## 2024-06-04 DIAGNOSIS — K5904 Chronic idiopathic constipation: Secondary | ICD-10-CM

## 2024-06-04 DIAGNOSIS — Z113 Encounter for screening for infections with a predominantly sexual mode of transmission: Secondary | ICD-10-CM | POA: Diagnosis not present

## 2024-06-04 LAB — POCT GLYCOSYLATED HEMOGLOBIN (HGB A1C): HbA1c, POC (controlled diabetic range): 7.5 % — AB (ref 0.0–7.0)

## 2024-06-04 MED ORDER — BASAGLAR KWIKPEN 100 UNIT/ML ~~LOC~~ SOPN: 36.0000 [IU] | PEN_INJECTOR | Freq: Every day | SUBCUTANEOUS | 11 refills | Status: AC

## 2024-06-04 MED ORDER — POLYETHYLENE GLYCOL 3350 17 GM/SCOOP PO POWD: 17.0000 g | Freq: Two times a day (BID) | ORAL | 1 refills | Status: AC | PRN

## 2024-06-04 MED ORDER — SENNA 8.6 MG PO TABS: 1.0000 | ORAL_TABLET | Freq: Every day | ORAL | 2 refills | Status: DC

## 2024-06-04 MED ORDER — MOUNJARO 7.5 MG/0.5ML ~~LOC~~ SOAJ: 7.5000 mg | SUBCUTANEOUS | 1 refills | Status: DC

## 2024-06-04 NOTE — Progress Notes (Signed)
 S:     Chief Complaint  Patient presents with   Medication Management    Diabetes - Follow-up   63 y.o. female who presents for diabetes evaluation, education, and management. Patient arrives in good spirits and presents without any assistance.   Patient was referred and last seen by Primary Care Provider, Dr. Bryan, on 02/19/2024.  At last visit, Mounjaro  (tirzepatide ) dose was increased and Basaglar  (insulin  glargine) dose was decreased.  Patient reports tolerating current therapy.  Notes lows only when she skips meals.   PMH is significant for hypertension, Hyperlipidemia.  Patient reports Diabetes was diagnosed ~ 3 years ago.   Current diabetes medications include: Basaglar  (insulin  glargine) 40 units daily, Mounjaro  (tirzepatide ) 5 mg weekly Current hypertension medications include: amlodipine  10 mg, losartan  100 mg Current hyperlipidemia medications include: atorvastatin  40 mg  Patient reports adherence to taking all medications as prescribed.   Do you feel that your medications are working for you? yes Have you been experiencing any side effects to the medications prescribed? no  Using finger stick glucose values for the last 2-3 weeks due to CGM sensor failure. Reported home fasting blood sugars: low to mid 100s   O:   Review of Systems  All other systems reviewed and are negative.   Physical Exam Vitals reviewed.  Constitutional:      Appearance: Normal appearance.  Pulmonary:     Effort: Pulmonary effort is normal.  Neurological:     Mental Status: She is alert.  Psychiatric:        Mood and Affect: Mood normal.        Thought Content: Thought content normal.        Judgment: Judgment normal.    Libre3 CGM Download from mid June (2-3 weeks ago) Average Glucose: 208 mg/dL Glucose Management Indicator: 8.3    Lab Results  Component Value Date   HGBA1C 7.5 (A) 06/04/2024   Vitals:   06/04/24 0903 06/04/24 0922  BP: (!) 141/74 (!) 141/71  Pulse:  77   SpO2: 100%     Lipid Panel     Component Value Date/Time   CHOL 206 (H) 06/28/2022 1013   TRIG 140 06/28/2022 1013   HDL 50 06/28/2022 1013   CHOLHDL 4.1 06/28/2022 1013   LDLCALC 131 (H) 06/28/2022 1013    Clinical Atherosclerotic Cardiovascular Disease (ASCVD): No  The 10-year ASCVD risk score (Arnett DK, et al., 2019) is: 21.7%   Values used to calculate the score:     Age: 23 years     Clincally relevant sex: Female     Is Non-Hispanic African American: Yes     Diabetic: Yes     Tobacco smoker: No     Systolic Blood Pressure: 141 mmHg     Is BP treated: Yes     HDL Cholesterol: 50 mg/dL     Total Cholesterol: 206 mg/dL   A/P: Diabetes diagnosed approximately 3 years ago and currently improved based on A1C of 7.2   Patient is able to verbalize appropriate hypoglycemia management plan. Medication adherence appears good. Reports tolerating Mounjaro  (tirzepatide ) at 5mg  weekly dose. - Weight goal discussed today 188-190 - Increase Mounjaro  (tirzepatide ) to 7.5mg  weekly after finishing current supply of 5mg  (3-4 weeks from now) - Continue 40 units of Basaglar  (insulin  glargine) until change in Mounjaro  (tirzepatide ) dosing.  At that time - Reduce from 40 to 36 units once daily.  -Patient educated on purpose, proper use, and potential adverse effects.  -Extensively  discussed pathophysiology of diabetes, recommended lifestyle interventions, dietary effects on blood sugar control.  -Counseled on s/sx of and management of hypoglycemia.   ASCVD risk - primary  prevention in patient with diabetes. Last LDL was at the time of statin initiation and was not at goal of <70 mg/dL. high intensity statin indicated.  - Lipid Panel today -Continued atorvastatin  40 mg daily  Hypertension currently managed with two agents (both at max doses).  Blood pressure goal of <130/80 mmHg. Medication adherence appears good. Blood pressure control remains higher than goal.   - UACR today. Consider  SGLT2 therapy  - Continue amlodipine  10mg  daily and losartan  100mg  daily at this time  Patient also seeing new PCP today. Defer hypertension management to Dr. Lorrane.   Written patient instructions provided. Patient verbalized understanding of treatment plan.  Total time in face to face counseling 28 minutes.    Follow-up:  Pharmacist PRN PCP clinic visit TODAY

## 2024-06-04 NOTE — Assessment & Plan Note (Signed)
 Diabetes diagnosed approximately 3 years ago and currently improved based on A1C of 7.2   Patient is able to verbalize appropriate hypoglycemia management plan. Medication adherence appears good. Reports tolerating Mounjaro  (tirzepatide ) at 5mg  weekly dose. - Weight goal discussed today 188-190 - Increase Mounjaro  (tirzepatide ) to 7.5mg  weekly after finishing current supply of 5mg  (3-4 weeks from now) - Continue 40 units of Basaglar  (insulin  glargine) until change in Mounjaro  (tirzepatide ) dosing.  At that time - Reduce from 40 to 36 units once daily.  -Patient educated on purpose, proper use, and potential adverse effects.  -Extensively discussed pathophysiology of diabetes, recommended lifestyle interventions, dietary effects on blood sugar control.

## 2024-06-04 NOTE — Assessment & Plan Note (Signed)
 141/71 this AM with repeat measurement. Jardiance should help, F/u in 2 weeks to start med and review ambulatory bp's

## 2024-06-04 NOTE — Patient Instructions (Addendum)
 It was nice to see you today!  You are doing fantastic with your weight.  Your A1C is much improved.   Your goal blood sugar is 80-130 before eating and less than 180 after eating.  Medication Changes: Increase your Mounjaro  (tirzepatide ) to 7.5mg  weekly after your finish your current supply of 5mg  At that time - Reduce your insulin  from 40 to 36 units once daily.   Continue all other medication the same.   Keep up the good work with diet and exercise. Aim for a diet full of vegetables, fruit and lean meats (chicken, [place], fish). Try to limit salt intake by eating fresh or frozen vegetables (instead of canned), rinse canned vegetables prior to cooking and do not add any additional salt to meals.

## 2024-06-04 NOTE — Progress Notes (Signed)
 Reviewed and agree with Dr Macky Lower plan.

## 2024-06-04 NOTE — Assessment & Plan Note (Signed)
 Encouraged to eat more fiber, restart MiraLAX  twice daily as needed, and started senna every morning.  Next colonoscopy 07/2035.

## 2024-06-04 NOTE — Assessment & Plan Note (Signed)
 A1c down from 13.9-7.5 today, on CGM.  Continue increasing Mounjaro  to therapeutic range 10-15 mg, while tapering glargine.  Decrease by 2 units if a.m. fasting blood glucoses less than 150.  Will start Jardiance next visit.  If fasting blood glucose is less than 90 instructed to contact office.

## 2024-06-04 NOTE — Progress Notes (Signed)
    SUBJECTIVE:   CHIEF COMPLAINT / HPI:   T2DM: met with Dr. Koval this AM, situated CGM and phone set-up for more efficient monitoring. Eating mostly boiled foods including chicken, green beans, and cabbage. A1c down from 13.9 to 7.5 today. Is hitting weight loss goal of <200 lbs by the 4th of July. Ordered UAC ratio today. No N/V, diarrhea, blood in stool, syncope, or fatigue.   HTN: repeat Bps >140 systolic this AM. On max Amlodipine  and Losartan , need home BP readings. Denies HA, peripheral edema, CP, SOB, or dizziness.  Constipation: reports BM once/week when not taking miralax . Not eating much fruits or vegetables, but is taking a fiber supplement. Colonoscopy found benign polyps 07/30/23, next due in 10 years.  Health maintenance: Patient follows up with mammograms and cervical screening with OB/GYN.  Never had HIV or HCV testing done. Encouraged pt to go to local pharmacy for second shingrix  dose  PERTINENT  PMH / PSH: Multinodular goiter  OBJECTIVE:   BP (!) 141/71   Pulse 77   Ht 5' 6 (1.676 m)   Wt 202 lb (91.6 kg)   SpO2 100%   BMI 32.60 kg/m    Cardiac: Regular rate and rhythm. Normal S1/S2. No murmurs, rubs, or gallops appreciated. Lungs: Clear bilaterally to ascultation.  Abdomen: Normoactive bowel sounds. No tenderness to deep or light palpation. No rebound or guarding.    Psych: Pleasant and appropriate    ASSESSMENT/PLAN:   Assessment & Plan Diabetes mellitus without complication (HCC) A1c down from 13.9-7.5 today, on CGM.  Continue increasing Mounjaro  to therapeutic range 10-15 mg, while tapering glargine.  Decrease by 2 units if a.m. fasting blood glucoses less than 150.  Will start Jardiance next visit.  If fasting blood glucose is less than 90 instructed to contact office. Need for hepatitis C screening test HCV lab ordered Routine screening for STI (sexually transmitted infection) HIV screening lab ordered, will be done at next visit due to small veins,  needs smaller needle Chronic idiopathic constipation Encouraged to eat more fiber, restart MiraLAX  twice daily as needed, and started senna every morning.  Next colonoscopy 07/2035. Multinodular goiter (nontoxic) Will do anti-TPO next visit when lab has proper needle size.  Hypertension, unspecified type 141/71 this AM with repeat measurement. Jardiance should help, F/u in 2 weeks to start med and review ambulatory bp's     Fairy Amy, MD [facility]

## 2024-06-04 NOTE — Patient Instructions (Addendum)
 It was wonderful to see you today.  Please bring ALL of your medications with you to every visit.   Today we talked about:  - If your blood glucose in the morning is <90 please call and we will reduce insulin  further, start tirzepetide 7.5 after the 5 mg is gone. -Constipation: take the miralax  every morning and night and the Senna once every morning. The goal is to have one bowel movement a day. Eat more fruits and vegetables -Hypertension: your blood pressure was a little elevated today, we would like to follow-up with that soon.  Please follow up in 2 weeks   Thank you for choosing [facility].   Please call [phone] with any questions about today's appointment.  Please be sure to schedule follow up at the front  desk before you leave today.

## 2024-06-04 NOTE — Assessment & Plan Note (Signed)
 ASCVD risk - primary  prevention in patient with diabetes. Last LDL was at the time of statin initiation and was not at goal of <70 mg/dL. high intensity statin indicated.  - Lipid Panel today - Continued atorvastatin  40 mg daily

## 2024-06-04 NOTE — Assessment & Plan Note (Signed)
 Hypertension currently managed with two agents (both at max doses).  Blood pressure goal of <130/80 mmHg. Medication adherence appears good. Blood pressure control remains higher than goal.   - UACR today. Consider SGLT2 therapy  - Continue amlodipine  10mg  daily and losartan  100mg 

## 2024-06-04 NOTE — Assessment & Plan Note (Signed)
 Will do anti-TPO next visit when lab has proper needle size.

## 2024-06-05 LAB — MICROALBUMIN / CREATININE URINE RATIO
Creatinine, Urine: 173.2 mg/dL
Microalb/Creat Ratio: 6 mg/g{creat} (ref 0–29)
Microalbumin, Urine: 10.9 ug/mL

## 2024-06-10 ENCOUNTER — Other Ambulatory Visit

## 2024-06-10 DIAGNOSIS — Z1159 Encounter for screening for other viral diseases: Secondary | ICD-10-CM | POA: Diagnosis not present

## 2024-06-10 DIAGNOSIS — Z113 Encounter for screening for infections with a predominantly sexual mode of transmission: Secondary | ICD-10-CM | POA: Diagnosis not present

## 2024-06-10 DIAGNOSIS — E119 Type 2 diabetes mellitus without complications: Secondary | ICD-10-CM | POA: Diagnosis not present

## 2024-06-11 ENCOUNTER — Ambulatory Visit: Payer: Self-pay

## 2024-06-11 LAB — LIPID PANEL
Chol/HDL Ratio: 3.1 ratio (ref 0.0–4.4)
Cholesterol, Total: 147 mg/dL (ref 100–199)
HDL: 47 mg/dL (ref >39)
LDL Chol Calc (NIH): 84 mg/dL (ref 0–99)
Triglycerides: 84 mg/dL (ref 0–149)
VLDL Cholesterol Cal: 16 mg/dL (ref 5–40)

## 2024-06-11 LAB — BASIC METABOLIC PANEL WITH GFR
BUN/Creatinine Ratio: 15 (ref 12–28)
BUN: 18 mg/dL (ref 8–27)
CO2: 21 mmol/L (ref 20–29)
Calcium: 10 mg/dL (ref 8.7–10.3)
Chloride: 104 mmol/L (ref 96–106)
Creatinine, Ser: 1.17 mg/dL — ABNORMAL HIGH (ref 0.57–1.00)
Glucose: 126 mg/dL — ABNORMAL HIGH (ref 70–99)
Potassium: 4.2 mmol/L (ref 3.5–5.2)
Sodium: 142 mmol/L (ref 134–144)
eGFR: 53 mL/min/1.73 — ABNORMAL LOW (ref >59)

## 2024-06-11 LAB — HIV ANTIBODY (ROUTINE TESTING W REFLEX): HIV Screen 4th Generation wRfx: NONREACTIVE

## 2024-06-11 LAB — HEPATITIS C ANTIBODY: Hep C Virus Ab: NONREACTIVE

## 2024-06-17 ENCOUNTER — Ambulatory Visit

## 2024-06-18 NOTE — Telephone Encounter (Signed)
 Patient contacted for follow-up of lab results obtained 06/10/2024  Since last contact patient reports she is doing well.   I shared results of labs including BMET, UACR, Lipid, HIV and Hepatitis  She was happy to hear that labs were stable, acceptable, improved, non-reactive and non-reactive.   Total time with patient call and documentation of interaction: 9 minutes.

## 2024-06-18 NOTE — Telephone Encounter (Signed)
-----   Message from [facility] McDiarmid sent at 06/13/2024  2:46 PM EDT -----  ----- Message ----- From: Rebecka Memos Lab Results In Sent: 06/05/2024   3:35 PM EDT To: Krystal BIRCH McDiarmid, MD

## 2024-06-19 NOTE — Progress Notes (Signed)
 Reviewed and agree with Dr Macky Lower plan.

## 2024-06-23 ENCOUNTER — Encounter (INDEPENDENT_AMBULATORY_CARE_PROVIDER_SITE_OTHER): Admitting: Ophthalmology

## 2024-06-24 ENCOUNTER — Encounter (INDEPENDENT_AMBULATORY_CARE_PROVIDER_SITE_OTHER): Admitting: Ophthalmology

## 2024-06-24 DIAGNOSIS — H35033 Hypertensive retinopathy, bilateral: Secondary | ICD-10-CM

## 2024-06-24 DIAGNOSIS — Z7985 Long-term (current) use of injectable non-insulin antidiabetic drugs: Secondary | ICD-10-CM

## 2024-06-24 DIAGNOSIS — E113313 Type 2 diabetes mellitus with moderate nonproliferative diabetic retinopathy with macular edema, bilateral: Secondary | ICD-10-CM | POA: Diagnosis not present

## 2024-06-24 DIAGNOSIS — H43813 Vitreous degeneration, bilateral: Secondary | ICD-10-CM

## 2024-06-24 DIAGNOSIS — I10 Essential (primary) hypertension: Secondary | ICD-10-CM

## 2024-07-22 ENCOUNTER — Encounter (INDEPENDENT_AMBULATORY_CARE_PROVIDER_SITE_OTHER): Admitting: Ophthalmology

## 2024-08-13 ENCOUNTER — Encounter (INDEPENDENT_AMBULATORY_CARE_PROVIDER_SITE_OTHER): Admitting: Ophthalmology

## 2024-08-13 DIAGNOSIS — Z7985 Long-term (current) use of injectable non-insulin antidiabetic drugs: Secondary | ICD-10-CM

## 2024-08-13 DIAGNOSIS — I10 Essential (primary) hypertension: Secondary | ICD-10-CM

## 2024-08-13 DIAGNOSIS — H35033 Hypertensive retinopathy, bilateral: Secondary | ICD-10-CM

## 2024-08-13 DIAGNOSIS — E113313 Type 2 diabetes mellitus with moderate nonproliferative diabetic retinopathy with macular edema, bilateral: Secondary | ICD-10-CM | POA: Diagnosis not present

## 2024-08-13 DIAGNOSIS — H43813 Vitreous degeneration, bilateral: Secondary | ICD-10-CM

## 2024-08-18 ENCOUNTER — Other Ambulatory Visit: Payer: Self-pay

## 2024-08-18 DIAGNOSIS — E119 Type 2 diabetes mellitus without complications: Secondary | ICD-10-CM

## 2024-08-18 DIAGNOSIS — I10 Essential (primary) hypertension: Secondary | ICD-10-CM

## 2024-08-18 MED ORDER — LOSARTAN POTASSIUM 100 MG PO TABS: 100.0000 mg | ORAL_TABLET | Freq: Every day | ORAL | 1 refills | Status: AC

## 2024-08-18 NOTE — Telephone Encounter (Signed)
 Chart reviewed  -Fairy Amy, MD

## 2024-08-24 ENCOUNTER — Other Ambulatory Visit: Payer: Self-pay | Admitting: Family Medicine

## 2024-08-24 DIAGNOSIS — E119 Type 2 diabetes mellitus without complications: Secondary | ICD-10-CM

## 2024-08-25 NOTE — Telephone Encounter (Signed)
 Chart reviewed  -Fairy Amy, MD

## 2024-09-03 ENCOUNTER — Other Ambulatory Visit: Payer: Self-pay

## 2024-09-03 DIAGNOSIS — K5904 Chronic idiopathic constipation: Secondary | ICD-10-CM

## 2024-09-03 NOTE — Telephone Encounter (Signed)
 Chart reviewed  -Fairy Amy, MD

## 2024-09-09 ENCOUNTER — Encounter (INDEPENDENT_AMBULATORY_CARE_PROVIDER_SITE_OTHER): Admitting: Ophthalmology

## 2024-10-21 ENCOUNTER — Encounter (INDEPENDENT_AMBULATORY_CARE_PROVIDER_SITE_OTHER): Admitting: Ophthalmology

## 2024-10-21 DIAGNOSIS — H35033 Hypertensive retinopathy, bilateral: Secondary | ICD-10-CM | POA: Diagnosis not present

## 2024-10-21 DIAGNOSIS — I10 Essential (primary) hypertension: Secondary | ICD-10-CM

## 2024-10-21 DIAGNOSIS — E113313 Type 2 diabetes mellitus with moderate nonproliferative diabetic retinopathy with macular edema, bilateral: Secondary | ICD-10-CM

## 2024-10-21 DIAGNOSIS — Z7985 Long-term (current) use of injectable non-insulin antidiabetic drugs: Secondary | ICD-10-CM

## 2024-10-21 DIAGNOSIS — H43813 Vitreous degeneration, bilateral: Secondary | ICD-10-CM

## 2024-10-22 ENCOUNTER — Ambulatory Visit (INDEPENDENT_AMBULATORY_CARE_PROVIDER_SITE_OTHER): Admitting: Family Medicine

## 2024-10-22 VITALS — BP 132/73 | HR 87 | Ht 66.0 in | Wt 194.4 lb

## 2024-10-22 DIAGNOSIS — Z23 Encounter for immunization: Secondary | ICD-10-CM

## 2024-10-22 DIAGNOSIS — R0981 Nasal congestion: Secondary | ICD-10-CM | POA: Diagnosis not present

## 2024-10-22 DIAGNOSIS — E119 Type 2 diabetes mellitus without complications: Secondary | ICD-10-CM

## 2024-10-22 LAB — POCT GLYCOSYLATED HEMOGLOBIN (HGB A1C): HbA1c, POC (controlled diabetic range): 6.8 % (ref 0.0–7.0)

## 2024-10-22 NOTE — Patient Instructions (Addendum)
 Thank you for coming in today! Here is a summary of what we discussed:  -Give us  a call if your congestion symptoms don't improve in the next 5-7 days or if you develop worsening symptoms  - Try calling the pharmacy or your insurance to ask about the CGM cost  -Please let us  know if you are concerned about low mood/difficult thoughts. See the info below for assistance during a mental health crisis.  Please call the clinic at [phone] if your symptoms worsen or you have any concerns.  Best, Dr Adele  [facility]  [address], [place], [place] [address] [phone] or [phone] [facility] 24/7 FOR ANYONE [address] [place], [place]  [phone] Fax: [phone] [email or web address] *Interpreters available *Accepts all insurance and uninsured for Urgent Care needs *Accepts Medicaid and uninsured for outpatient treatment     ONLY FOR [facility]  New patient assessment and therapy walk-ins Mondays and Wednesdays 8am-11am First and second Fridays 1pm-5pm  New patient psychiatry and medication management walk-ins:  Mondays, Wednesdays, Thursdays, Fridays 8am -11am NO PSYCHIATRY WALK-INS on TUESDAYS

## 2024-10-22 NOTE — Assessment & Plan Note (Addendum)
 Patient doing well with A1c improved to 6.8.  Provided CGM samples today.  Advised patient to call insurance/pharmacy to inquire about financial assistance programs.  Sent referral for podiatry.  Advised 20-month follow-up to recheck A1c or sooner if needed.

## 2024-10-22 NOTE — Progress Notes (Signed)
    SUBJECTIVE:   CHIEF COMPLAINT / HPI:   Type 2 diabetes - Last A1c 7.5 on July 2 --6.8 today! - Medications: 40 units insulin  daily, Mounjaro  7.5 mg weekly - has not been using CGM recently due to cost ($70) - has been feeling sick and taking Theraflu and another OTC med - glucose readings were 120s, doesn't eat during the day - Established with ophthalmology, has not been seen by podiatry before  HTN --no concerns --taking meds daily  Sinus congestion --symptoms for about 2 weeks --no sore throat, cough, or fevers --lots of coworkers have been sick recently  Healthcare maintenance --States she will be getting Pap smear and mammogram through her OB/GYN in December  PERTINENT  PMH / PSH: HTN, GERD, chronic idiopathic constipation, multinodular goiter, type 2 diabetes, hyperlipidemia  OBJECTIVE:   BP 132/73   Pulse 87   Ht 5' 6 (1.676 m)   Wt 194 lb 6.4 oz (88.2 kg)   SpO2 97%   BMI 31.38 kg/m   - General: No acute distress. Awake and conversant. - Eyes: Normal conjunctiva, anicteric. Round symmetric pupils. - ENT: Hearing grossly intact. No nasal discharge. - Neck: Neck is supple. No masses or thyromegaly. - Respiratory: Respirations are non-labored. - Skin: No visible rashes or ulcers. - Psych: Alert and oriented. Cooperative, Appropriate mood and affect, Normal judgment. - MSK: Normal ambulation. - Neuro: Sensation and CN II-XII grossly normal.  ASSESSMENT/PLAN:   Assessment & Plan Diabetes mellitus without complication (HCC) Patient doing well with A1c improved to 6.8.  Provided CGM samples today.  Advised patient to call insurance/pharmacy to inquire about financial assistance programs.  Sent referral for podiatry.  Advised 1-month follow-up to recheck A1c or sooner if needed. Sinus congestion Suspect viral infection given overall well appearance and sick contacts.  Advised symptomatic treatment with ibuprofen or Tylenol and good p.o. fluid intake.   Reviewed return precautions.     Rea Raring, MD [facility]

## 2024-10-23 ENCOUNTER — Ambulatory Visit

## 2024-11-03 ENCOUNTER — Other Ambulatory Visit: Payer: Self-pay

## 2024-11-03 ENCOUNTER — Ambulatory Visit: Admitting: Podiatry

## 2024-11-03 DIAGNOSIS — B351 Tinea unguium: Secondary | ICD-10-CM | POA: Diagnosis not present

## 2024-11-03 DIAGNOSIS — E119 Type 2 diabetes mellitus without complications: Secondary | ICD-10-CM

## 2024-11-03 DIAGNOSIS — Z0189 Encounter for other specified special examinations: Secondary | ICD-10-CM

## 2024-11-03 NOTE — Telephone Encounter (Signed)
 Chart reviewed, has room to titrate up Mounjaro , encouraged to schedule diabetes check appt.  Champ Amy, MD

## 2024-11-03 NOTE — Patient Instructions (Signed)

## 2024-11-03 NOTE — Progress Notes (Signed)
 Subjective:   Patient ID: Allison Lopez, female   DOB: 63 y.o.   MRN: [id number]   HPI Chief Complaint  Patient presents with   Diabetes    Pt stated that she went in to have her A1c checked and they wanted her to come in and have her feet examined.     63 year old female presents the office today above concerns.  She states that at times her big toes will get sore but she thinks it is within the nails need to be trimmed.  She does not report any swelling, redness or any drainage.  Does not report any open lesions, claudication symptoms or neuropathy.  Lab Results  Component Value Date   HGBA1C 6.8 10/22/2024   HGBA1C 7.5 (A) 06/04/2024   HGBA1C 13.9 (A) 01/08/2024     Review of Systems  All other systems reviewed and are negative.       Objective:  Physical Exam  General: AAO x3, NAD  Dermatological: Mild chronic incurvation present to hallux toenails without any edema, erythema, drainage or purulence or any signs of infection today.  The nails are mildly hypertrophic, dystrophic with some debris present.  No open lesions.  Vascular: Dorsalis Pedis artery and Posterior Tibial artery pedal pulses are 2/4 bilateral with immedate capillary fill time. There is no pain with calf compression, swelling, warmth, erythema.   Neruologic: Grossly intact via light touch bilateral.  Sensation intact with Semmes Weinstein monofilament.  Vibratory sensation intact.  Musculoskeletal: No other areas of discomfort.       Assessment:   63 year old female with noninfected chronic ingrown toenails; diabetic foot exam     Plan:  -Treatment options discussed including all alternatives, risks, and complications -Etiology of symptoms were discussed - We discussed possible partial nail avulsions but mutually agreed to hold off on this today as her symptoms are mild and appears to the tip of the toenail.  Recommend routine trimming of the toenails.  If symptoms were to worsen or change,  was proceed with a partial nail avulsion if needed. -I did trim the bilateral hallux nail sending this for culture, pathology and sent to [facility].   No follow-ups on file.  Donnice JONELLE Fees DPM

## 2024-11-10 ENCOUNTER — Other Ambulatory Visit: Payer: Self-pay | Admitting: Podiatry

## 2024-11-13 ENCOUNTER — Ambulatory Visit: Payer: Self-pay | Admitting: Podiatry

## 2024-11-17 ENCOUNTER — Encounter (INDEPENDENT_AMBULATORY_CARE_PROVIDER_SITE_OTHER): Admitting: Ophthalmology

## 2024-11-17 DIAGNOSIS — Z7985 Long-term (current) use of injectable non-insulin antidiabetic drugs: Secondary | ICD-10-CM

## 2024-11-17 DIAGNOSIS — I10 Essential (primary) hypertension: Secondary | ICD-10-CM

## 2024-11-17 DIAGNOSIS — E113313 Type 2 diabetes mellitus with moderate nonproliferative diabetic retinopathy with macular edema, bilateral: Secondary | ICD-10-CM | POA: Diagnosis not present

## 2024-11-17 DIAGNOSIS — H35033 Hypertensive retinopathy, bilateral: Secondary | ICD-10-CM | POA: Diagnosis not present

## 2024-11-17 DIAGNOSIS — H43813 Vitreous degeneration, bilateral: Secondary | ICD-10-CM

## 2024-12-15 ENCOUNTER — Encounter (INDEPENDENT_AMBULATORY_CARE_PROVIDER_SITE_OTHER): Admitting: Ophthalmology

## 2024-12-15 DIAGNOSIS — E113313 Type 2 diabetes mellitus with moderate nonproliferative diabetic retinopathy with macular edema, bilateral: Secondary | ICD-10-CM

## 2024-12-15 DIAGNOSIS — H43813 Vitreous degeneration, bilateral: Secondary | ICD-10-CM | POA: Diagnosis not present

## 2024-12-15 DIAGNOSIS — Z7985 Long-term (current) use of injectable non-insulin antidiabetic drugs: Secondary | ICD-10-CM

## 2024-12-15 DIAGNOSIS — I10 Essential (primary) hypertension: Secondary | ICD-10-CM | POA: Diagnosis not present

## 2024-12-15 DIAGNOSIS — H35033 Hypertensive retinopathy, bilateral: Secondary | ICD-10-CM

## 2025-01-04 ENCOUNTER — Other Ambulatory Visit: Payer: Self-pay

## 2025-01-04 DIAGNOSIS — E119 Type 2 diabetes mellitus without complications: Secondary | ICD-10-CM

## 2025-01-09 ENCOUNTER — Other Ambulatory Visit: Payer: Self-pay | Admitting: Obstetrics

## 2025-01-09 DIAGNOSIS — E049 Nontoxic goiter, unspecified: Secondary | ICD-10-CM

## 2025-01-12 ENCOUNTER — Encounter (INDEPENDENT_AMBULATORY_CARE_PROVIDER_SITE_OTHER): Admitting: Ophthalmology

## 2025-01-16 ENCOUNTER — Ambulatory Visit: Payer: Self-pay

## 2025-01-22 ENCOUNTER — Other Ambulatory Visit

## 2025-11-03 ENCOUNTER — Ambulatory Visit: Admitting: Podiatry
